# Patient Record
Sex: Female | Born: 1975 | Race: Asian | Hispanic: No | Marital: Single | Smoking: Never smoker
Health system: Southern US, Community
[De-identification: ages and names within clinical notes are randomized; demographics above are authoritative.]

## PROBLEM LIST (undated history)

## (undated) DIAGNOSIS — Z8742 Personal history of other diseases of the female genital tract: Secondary | ICD-10-CM

## (undated) DIAGNOSIS — D649 Anemia, unspecified: Secondary | ICD-10-CM

## (undated) HISTORY — DX: Personal history of other diseases of the female genital tract: Z87.42

## (undated) HISTORY — PX: OTHER SURGICAL HISTORY: SHX169

## (undated) HISTORY — DX: Anemia, unspecified: D64.9

## (undated) HISTORY — PX: NO PAST SURGERIES: SHX2092

---

## 2016-02-12 DIAGNOSIS — Z124 Encounter for screening for malignant neoplasm of cervix: Secondary | ICD-10-CM | POA: Diagnosis not present

## 2016-02-12 DIAGNOSIS — Z01419 Encounter for gynecological examination (general) (routine) without abnormal findings: Secondary | ICD-10-CM | POA: Diagnosis not present

## 2016-02-12 DIAGNOSIS — R87612 Low grade squamous intraepithelial lesion on cytologic smear of cervix (LGSIL): Secondary | ICD-10-CM | POA: Diagnosis not present

## 2016-02-12 LAB — HM PAP SMEAR: HM PAP: POSITIVE

## 2016-05-09 DIAGNOSIS — N870 Mild cervical dysplasia: Secondary | ICD-10-CM | POA: Diagnosis not present

## 2016-05-09 DIAGNOSIS — R87810 Cervical high risk human papillomavirus (HPV) DNA test positive: Secondary | ICD-10-CM | POA: Diagnosis not present

## 2016-05-09 DIAGNOSIS — R87612 Low grade squamous intraepithelial lesion on cytologic smear of cervix (LGSIL): Secondary | ICD-10-CM | POA: Diagnosis not present

## 2016-09-05 ENCOUNTER — Ambulatory Visit: Payer: Self-pay | Admitting: Internal Medicine

## 2016-09-05 ENCOUNTER — Ambulatory Visit (INDEPENDENT_AMBULATORY_CARE_PROVIDER_SITE_OTHER): Payer: 59 | Admitting: Internal Medicine

## 2016-09-05 ENCOUNTER — Encounter: Payer: Self-pay | Admitting: Internal Medicine

## 2016-09-05 DIAGNOSIS — Z8371 Family history of colonic polyps: Secondary | ICD-10-CM | POA: Diagnosis not present

## 2016-09-05 DIAGNOSIS — E559 Vitamin D deficiency, unspecified: Secondary | ICD-10-CM | POA: Diagnosis not present

## 2016-09-05 DIAGNOSIS — D649 Anemia, unspecified: Secondary | ICD-10-CM | POA: Diagnosis not present

## 2016-09-05 DIAGNOSIS — Z87898 Personal history of other specified conditions: Secondary | ICD-10-CM

## 2016-09-05 DIAGNOSIS — Z8742 Personal history of other diseases of the female genital tract: Secondary | ICD-10-CM

## 2016-09-05 NOTE — Progress Notes (Signed)
Patient ID: Alicia Huang, female   DOB: [birth year], 40 y.o.   MRN: [id number]   Subjective:    Patient ID: Alicia Huang, female    DOB: Nov 03, 1976, 40 y.o.   MRN: [id number]  HPI  Patient here for to establish care.  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  She stays active.  No chest pain or tightness.  No sob.  No acid reflux.  No abdominal pain or cramping.  Bowels stable.  Sees gyn.  Had abnormal pap smear.  S/p colposcopy.  States everything checked out fine last check.  Recommended f/u in one year.  Has been told was vitamin D deficient.  Also has had some issues with anemia.  Periods regular.  LMP now.    Past Medical History:  Diagnosis Date  . Anemia   . History of abnormal cervical Pap smear    Past Surgical History:  Procedure Laterality Date  . colpolscopy     Family History  Problem Relation Age of Onset  . Hyperlipidemia Mother   . Hyperlipidemia Father   . Hypertension Father    Social History   Social History  . Marital status: Unknown    Spouse name: N/A  . Number of children: N/A  . Years of education: N/A   Social History Main Topics  . Smoking status: Never Smoker  . Smokeless tobacco: Never Used  . Alcohol use Yes  . Drug use: No  . Sexual activity: Not Asked   Other Topics Concern  . None   Social History Narrative  . None    Review of Systems  Constitutional: Negative for appetite change and unexpected weight change.  HENT: Negative for congestion and sinus pressure.   Respiratory: Negative for cough, chest tightness and shortness of breath.   Cardiovascular: Negative for chest pain, palpitations and leg swelling.  Gastrointestinal: Negative for abdominal pain, diarrhea, nausea and vomiting.  Genitourinary: Negative for difficulty urinating and dysuria.  Musculoskeletal: Negative for back pain and joint swelling.  Skin: Negative for color change and rash.  Neurological: Negative for dizziness, light-headedness and headaches.    Psychiatric/Behavioral: Negative for agitation and dysphoric mood.       Objective:    Physical Exam  Constitutional: She appears well-developed and well-nourished. No distress.  HENT:  Nose: Nose normal.  Mouth/Throat: Oropharynx is clear and moist.  Neck: Neck supple. No thyromegaly present.  Cardiovascular: Normal rate and regular rhythm.   Pulmonary/Chest: Breath sounds normal. No respiratory distress. She has no wheezes.  Abdominal: Soft. Bowel sounds are normal. There is no tenderness.  Musculoskeletal: She exhibits no edema or tenderness.  Lymphadenopathy:    She has no cervical adenopathy.  Skin: No rash noted. No erythema.  Psychiatric: She has a normal mood and affect. Her behavior is normal.    BP 120/72   Pulse 76   Temp 97.8 F (36.6 C) (Oral)   Ht 5\' 1"  (1.549 m)   Wt 115 lb 3.2 oz (52.3 kg)   LMP 09/04/2016 (Exact Date)   SpO2 98%   BMI 21.77 kg/m  Wt Readings from Last 3 Encounters:  09/05/16 115 lb 3.2 oz (52.3 kg)       Assessment & Plan:   Problem List Items Addressed This Visit    Anemia   Relevant Orders   CBC with Differential/Platelet   Comprehensive metabolic panel   TSH   Family history of colonic polyps    States mother and father have history of polyps.  Discussed need for earlier screening colonoscopy.        History of abnormal cervical Pap smear    Seeing gyn.  S/p colposcopy.  Recommended f/u in one year.        Vitamin D deficiency    Found on last lab check.  Discussed supplements.  Follow vitamin D level.        Relevant Orders   VITAMIN D 25 Hydroxy (Vit-D Deficiency, Fractures)    Other Visit Diagnoses   None.      Einar Pheasant, MD

## 2016-09-05 NOTE — Progress Notes (Signed)
Pre visit review using our clinic review tool, if applicable. No additional management support is needed unless otherwise documented below in the visit note. 

## 2016-09-06 ENCOUNTER — Encounter: Payer: Self-pay | Admitting: Internal Medicine

## 2016-09-06 DIAGNOSIS — D649 Anemia, unspecified: Secondary | ICD-10-CM | POA: Insufficient documentation

## 2016-09-06 DIAGNOSIS — Z8371 Family history of colonic polyps: Secondary | ICD-10-CM | POA: Insufficient documentation

## 2016-09-06 DIAGNOSIS — E559 Vitamin D deficiency, unspecified: Secondary | ICD-10-CM | POA: Insufficient documentation

## 2016-09-06 DIAGNOSIS — Z862 Personal history of diseases of the blood and blood-forming organs and certain disorders involving the immune mechanism: Secondary | ICD-10-CM | POA: Insufficient documentation

## 2016-09-06 DIAGNOSIS — Z8742 Personal history of other diseases of the female genital tract: Secondary | ICD-10-CM | POA: Insufficient documentation

## 2016-09-06 NOTE — Assessment & Plan Note (Signed)
States mother and father have history of polyps.  Discussed need for earlier screening colonoscopy.

## 2016-09-06 NOTE — Assessment & Plan Note (Signed)
Found on last lab check.  Discussed supplements.  Follow vitamin D level.

## 2016-09-06 NOTE — Assessment & Plan Note (Signed)
Seeing gyn.  S/p colposcopy.  Recommended f/u in one year.

## 2016-09-11 ENCOUNTER — Other Ambulatory Visit (INDEPENDENT_AMBULATORY_CARE_PROVIDER_SITE_OTHER): Payer: 59

## 2016-09-11 DIAGNOSIS — D649 Anemia, unspecified: Secondary | ICD-10-CM

## 2016-09-11 DIAGNOSIS — E559 Vitamin D deficiency, unspecified: Secondary | ICD-10-CM | POA: Diagnosis not present

## 2016-09-11 LAB — CBC WITH DIFFERENTIAL/PLATELET
BASOS ABS: 0 10*3/uL (ref 0.0–0.1)
Basophils Relative: 0.7 % (ref 0.0–3.0)
EOS ABS: 0.5 10*3/uL (ref 0.0–0.7)
Eosinophils Relative: 11.7 % — ABNORMAL HIGH (ref 0.0–5.0)
HEMATOCRIT: 32 % — AB (ref 36.0–46.0)
HEMOGLOBIN: 10.6 g/dL — AB (ref 12.0–15.0)
LYMPHS PCT: 39.1 % (ref 12.0–46.0)
Lymphs Abs: 1.7 10*3/uL (ref 0.7–4.0)
MCHC: 33.2 g/dL (ref 30.0–36.0)
MCV: 84.5 fl (ref 78.0–100.0)
MONO ABS: 0.4 10*3/uL (ref 0.1–1.0)
Monocytes Relative: 8.4 % (ref 3.0–12.0)
Neutro Abs: 1.7 10*3/uL (ref 1.4–7.7)
Neutrophils Relative %: 40.1 % — ABNORMAL LOW (ref 43.0–77.0)
Platelets: 314 10*3/uL (ref 150.0–400.0)
RBC: 3.79 Mil/uL — AB (ref 3.87–5.11)
RDW: 14.8 % (ref 11.5–15.5)
WBC: 4.2 10*3/uL (ref 4.0–10.5)

## 2016-09-11 LAB — COMPREHENSIVE METABOLIC PANEL
ALBUMIN: 4.1 g/dL (ref 3.5–5.2)
ALT: 8 U/L (ref 0–35)
AST: 13 U/L (ref 0–37)
Alkaline Phosphatase: 48 U/L (ref 39–117)
BILIRUBIN TOTAL: 0.5 mg/dL (ref 0.2–1.2)
BUN: 11 mg/dL (ref 6–23)
CALCIUM: 9.3 mg/dL (ref 8.4–10.5)
CO2: 29 mEq/L (ref 19–32)
CREATININE: 0.61 mg/dL (ref 0.40–1.20)
Chloride: 106 mEq/L (ref 96–112)
GFR: 115.85 mL/min (ref >60.00)
Glucose, Bld: 88 mg/dL (ref 70–99)
Potassium: 4.4 mEq/L (ref 3.5–5.1)
SODIUM: 141 meq/L (ref 135–145)
Total Protein: 7.1 g/dL (ref 6.0–8.3)

## 2016-09-11 LAB — TSH: TSH: 3.18 u[IU]/mL (ref 0.35–4.50)

## 2016-09-11 LAB — VITAMIN D 25 HYDROXY (VIT D DEFICIENCY, FRACTURES): VITD: 23.59 ng/mL — AB (ref 30.00–100.00)

## 2016-09-12 ENCOUNTER — Ambulatory Visit: Payer: Self-pay | Admitting: Internal Medicine

## 2016-09-12 ENCOUNTER — Other Ambulatory Visit: Payer: Self-pay | Admitting: Internal Medicine

## 2016-09-12 DIAGNOSIS — D649 Anemia, unspecified: Secondary | ICD-10-CM

## 2016-09-12 NOTE — Progress Notes (Signed)
Order placed for f/u labs.  

## 2016-10-21 ENCOUNTER — Encounter: Payer: Self-pay | Admitting: Internal Medicine

## 2016-10-27 ENCOUNTER — Encounter: Payer: Self-pay | Admitting: Internal Medicine

## 2016-10-27 DIAGNOSIS — Z0000 Encounter for general adult medical examination without abnormal findings: Secondary | ICD-10-CM | POA: Insufficient documentation

## 2016-11-20 ENCOUNTER — Other Ambulatory Visit (INDEPENDENT_AMBULATORY_CARE_PROVIDER_SITE_OTHER): Payer: 59

## 2016-11-20 ENCOUNTER — Other Ambulatory Visit: Payer: Self-pay | Admitting: Internal Medicine

## 2016-11-20 DIAGNOSIS — D649 Anemia, unspecified: Secondary | ICD-10-CM

## 2016-11-20 DIAGNOSIS — D509 Iron deficiency anemia, unspecified: Secondary | ICD-10-CM

## 2016-11-20 LAB — VITAMIN B12: Vitamin B-12: 453 pg/mL (ref 211–911)

## 2016-11-20 LAB — IBC PANEL
IRON: 29 ug/dL — AB (ref 42–145)
Saturation Ratios: 7 % — ABNORMAL LOW (ref 20.0–50.0)
Transferrin: 294 mg/dL (ref 212.0–360.0)

## 2016-11-20 LAB — CBC WITH DIFFERENTIAL/PLATELET
BASOS ABS: 0 10*3/uL (ref 0.0–0.1)
BASOS PCT: 0.7 % (ref 0.0–3.0)
Eosinophils Absolute: 0.7 10*3/uL (ref 0.0–0.7)
Eosinophils Relative: 12.4 % — ABNORMAL HIGH (ref 0.0–5.0)
HEMATOCRIT: 32 % — AB (ref 36.0–46.0)
Hemoglobin: 10.8 g/dL — ABNORMAL LOW (ref 12.0–15.0)
LYMPHS PCT: 26 % (ref 12.0–46.0)
Lymphs Abs: 1.5 10*3/uL (ref 0.7–4.0)
MCHC: 33.6 g/dL (ref 30.0–36.0)
MCV: 85.2 fl (ref 78.0–100.0)
MONOS PCT: 9.2 % (ref 3.0–12.0)
Monocytes Absolute: 0.5 10*3/uL (ref 0.1–1.0)
NEUTROS ABS: 3 10*3/uL (ref 1.4–7.7)
Neutrophils Relative %: 51.7 % (ref 43.0–77.0)
PLATELETS: 269 10*3/uL (ref 150.0–400.0)
RBC: 3.76 Mil/uL — ABNORMAL LOW (ref 3.87–5.11)
RDW: 16.3 % — ABNORMAL HIGH (ref 11.5–15.5)
WBC: 5.8 10*3/uL (ref 4.0–10.5)

## 2016-11-20 LAB — FERRITIN: Ferritin: 6 ng/mL — ABNORMAL LOW (ref 10.0–291.0)

## 2016-11-20 NOTE — Progress Notes (Signed)
Order placed for f/u cbc and ferritin.   

## 2017-07-31 DIAGNOSIS — N921 Excessive and frequent menstruation with irregular cycle: Secondary | ICD-10-CM | POA: Diagnosis not present

## 2017-07-31 DIAGNOSIS — Z01419 Encounter for gynecological examination (general) (routine) without abnormal findings: Secondary | ICD-10-CM | POA: Diagnosis not present

## 2017-09-07 ENCOUNTER — Encounter: Payer: 59 | Admitting: Internal Medicine

## 2017-09-25 ENCOUNTER — Encounter: Payer: 59 | Admitting: Internal Medicine

## 2017-10-09 ENCOUNTER — Ambulatory Visit (INDEPENDENT_AMBULATORY_CARE_PROVIDER_SITE_OTHER): Payer: 59 | Admitting: Internal Medicine

## 2017-10-09 ENCOUNTER — Encounter: Payer: Self-pay | Admitting: Internal Medicine

## 2017-10-09 ENCOUNTER — Other Ambulatory Visit: Payer: Self-pay

## 2017-10-09 VITALS — BP 110/60 | HR 76 | Temp 98.0°F | Resp 14 | Ht 61.0 in | Wt 116.2 lb

## 2017-10-09 DIAGNOSIS — D649 Anemia, unspecified: Secondary | ICD-10-CM | POA: Diagnosis not present

## 2017-10-09 DIAGNOSIS — E559 Vitamin D deficiency, unspecified: Secondary | ICD-10-CM

## 2017-10-09 DIAGNOSIS — Z0001 Encounter for general adult medical examination with abnormal findings: Secondary | ICD-10-CM | POA: Diagnosis not present

## 2017-10-09 DIAGNOSIS — Z0000 Encounter for general adult medical examination without abnormal findings: Secondary | ICD-10-CM | POA: Diagnosis not present

## 2017-10-09 DIAGNOSIS — N926 Irregular menstruation, unspecified: Secondary | ICD-10-CM | POA: Diagnosis not present

## 2017-10-09 DIAGNOSIS — Z8742 Personal history of other diseases of the female genital tract: Secondary | ICD-10-CM

## 2017-10-09 DIAGNOSIS — Z8371 Family history of colonic polyps: Secondary | ICD-10-CM

## 2017-10-09 DIAGNOSIS — Z87898 Personal history of other specified conditions: Secondary | ICD-10-CM

## 2017-10-09 DIAGNOSIS — Z1239 Encounter for other screening for malignant neoplasm of breast: Secondary | ICD-10-CM

## 2017-10-09 DIAGNOSIS — Z1231 Encounter for screening mammogram for malignant neoplasm of breast: Secondary | ICD-10-CM | POA: Diagnosis not present

## 2017-10-09 LAB — POCT URINE PREGNANCY: Preg Test, Ur: NEGATIVE

## 2017-10-09 NOTE — Progress Notes (Signed)
Patient ID: Alicia Huang, female   DOB: [birth year], 41 y.o.   MRN: [id number]   Subjective:    Patient ID: Alicia Huang, female    DOB: [birth year], 41 y.o.   MRN: [id number]  HPI  Patient with past history of anemia and abnormal pap smear.  Followed by gyn.  Her to follow up on these issues as well as for a complete physical exam.  Just saw Dr Leafy Ro 07/31/17.  Note reviewed.  Had pap.  Reports was having problems with her period.  Heavy period and having two periods per month.  She is taking an herbal medication and acupuncture.  States prior to this month she had two regular periods.  Now with light spotting.  Does not use protection.  Eating and drinking well.  Has been exercising more.  No chest pain.  No sob.  No acid reflux.  No abdominal pain.  Bowels moving.     Past Medical History:  Diagnosis Date  . Anemia   . History of abnormal cervical Pap smear    Past Surgical History:  Procedure Laterality Date  . colpolscopy     Family History  Problem Relation Age of Onset  . Hyperlipidemia Mother   . Hyperlipidemia Father   . Hypertension Father    Social History   Socioeconomic History  . Marital status: Unknown    Spouse name: None  . Number of children: None  . Years of education: None  . Highest education level: None  Social Needs  . Financial resource strain: None  . Food insecurity - worry: None  . Food insecurity - inability: None  . Transportation needs - medical: None  . Transportation needs - non-medical: None  Occupational History  . None  Tobacco Use  . Smoking status: Never Smoker  . Smokeless tobacco: Never Used  Substance and Sexual Activity  . Alcohol use: Yes  . Drug use: No  . Sexual activity: None  Other Topics Concern  . None  Social History Narrative  . None    Outpatient Encounter Medications as of 10/09/2017  Medication Sig  . ferrous sulfate 300 (60 Fe) MG/5ML syrup Take by mouth.  . Omega-3 Fatty Acids (FISH OIL PO) Take by  mouth.   No facility-administered encounter medications on file as of 10/09/2017.     Review of Systems  Constitutional: Negative for appetite change and unexpected weight change.  HENT: Negative for congestion and sinus pressure.   Eyes: Negative for pain and visual disturbance.  Respiratory: Negative for cough, chest tightness and shortness of breath.   Cardiovascular: Negative for chest pain, palpitations and leg swelling.  Gastrointestinal: Negative for abdominal pain, diarrhea, nausea and vomiting.  Genitourinary: Negative for difficulty urinating and dysuria.       Period change as outlined.    Musculoskeletal: Negative for back pain and joint swelling.  Skin: Negative for color change and rash.  Neurological: Negative for dizziness, light-headedness and headaches.  Hematological: Negative for adenopathy. Does not bruise/bleed easily.  Psychiatric/Behavioral: Negative for agitation and dysphoric mood.       Objective:    Physical Exam  Constitutional: She is oriented to person, place, and time. She appears well-developed and well-nourished. No distress.  HENT:  Nose: Nose normal.  Mouth/Throat: Oropharynx is clear and moist.  Eyes: Right eye exhibits no discharge. Left eye exhibits no discharge. No scleral icterus.  Neck: Neck supple. No thyromegaly present.  Cardiovascular: Normal rate and regular rhythm.  Pulmonary/Chest: Breath sounds normal. No  accessory muscle usage. No tachypnea. No respiratory distress. She has no decreased breath sounds. She has no wheezes. She has no rhonchi. Right breast exhibits no inverted nipple, no mass, no nipple discharge and no tenderness (no axillary adenopathy). Left breast exhibits no inverted nipple, no mass, no nipple discharge and no tenderness (no axilarry adenopathy).  Abdominal: Soft. Bowel sounds are normal. There is no tenderness.  Musculoskeletal: She exhibits no edema or tenderness.  Lymphadenopathy:    She has no cervical  adenopathy.  Neurological: She is alert and oriented to person, place, and time.  Skin: Skin is warm. No rash noted. No erythema.  Psychiatric: She has a normal mood and affect. Her behavior is normal.    BP 110/60 (BP Location: Left Arm, Patient Position: Sitting, Cuff Size: Normal)   Pulse 76   Temp 98 F (36.7 C) (Oral)   Resp 14   Ht 5\' 1"  (1.549 m)   Wt 116 lb 3.2 oz (52.7 kg)   LMP 09/28/2017   SpO2 98%   BMI 21.96 kg/m  Wt Readings from Last 3 Encounters:  10/09/17 116 lb 3.2 oz (52.7 kg)  09/05/16 115 lb 3.2 oz (52.3 kg)     Lab Results  Component Value Date   WBC 5.8 11/20/2016   HGB 10.8 (L) 11/20/2016   HCT 32.0 (L) 11/20/2016   PLT 269.0 11/20/2016   GLUCOSE 88 09/11/2016   ALT 8 09/11/2016   AST 13 09/11/2016   NA 141 09/11/2016   K 4.4 09/11/2016   CL 106 09/11/2016   CREATININE 0.61 09/11/2016   BUN 11 09/11/2016   CO2 29 09/11/2016   TSH 3.18 09/11/2016       Assessment & Plan:   Problem List Items Addressed This Visit    Anemia    Follow cbc.       Relevant Medications   ferrous sulfate 300 (60 Fe) MG/5ML syrup   Family history of colonic polyps    States mother and father had polyps.  Have discussed the need for early screening.        Healthcare maintenance    Physical today 10/09/17.  Sees gyn for pelvic and pap smears.  Just recently evaluated.  Schedule mammogram.        History of abnormal cervical Pap smear    Followed by gyn.       Vitamin D deficiency    Follow vitamin D level.         Other Visit Diagnoses    Abnormal menstrual periods    -  Primary   period change as outlined.  discussed with her.  urine pregnancy test negative.  have her keep menstrual diary.  has started exercising more.  follow.    Relevant Orders   POCT urine pregnancy (Completed)   Breast cancer screening       Relevant Orders   MM DIGITAL SCREENING BILATERAL       Einar Pheasant, MD

## 2017-10-11 NOTE — Assessment & Plan Note (Signed)
Follow vitamin D level.  

## 2017-10-11 NOTE — Assessment & Plan Note (Signed)
Follow cbc.  

## 2017-10-11 NOTE — Assessment & Plan Note (Signed)
Followed by gyn

## 2017-10-11 NOTE — Assessment & Plan Note (Signed)
States mother and father had polyps.  Have discussed the need for early screening.

## 2017-10-11 NOTE — Assessment & Plan Note (Signed)
Physical today 10/09/17.  Sees gyn for pelvic and pap smears.  Just recently evaluated.  Schedule mammogram.

## 2017-10-12 ENCOUNTER — Telehealth: Payer: Self-pay | Admitting: *Deleted

## 2017-10-12 NOTE — Telephone Encounter (Signed)
Left message for patient to return cal to office. [facility]EC nurse may give message and document answer .

## 2017-10-12 NOTE — Telephone Encounter (Signed)
-----   Message from Einar Pheasant, MD sent at 10/11/2017  5:16 PM EST ----- Regarding: colon screening Please call pt and notify her that after she left, I was reviewing the chart and that if both of her parents have a history of colon polyps, she needs to be referred for possible colonoscopy.  If agreeable let me know and I will place the order.  Thanks  (let me know either way).    Dr Nicki Reaper

## 2017-10-30 ENCOUNTER — Encounter: Payer: Self-pay | Admitting: *Deleted

## 2017-11-03 ENCOUNTER — Ambulatory Visit: Payer: 59

## 2017-11-11 ENCOUNTER — Telehealth: Payer: Self-pay | Admitting: Internal Medicine

## 2017-11-11 DIAGNOSIS — R0683 Snoring: Secondary | ICD-10-CM

## 2017-11-11 NOTE — Telephone Encounter (Signed)
Please advise 

## 2017-11-11 NOTE — Telephone Encounter (Signed)
Please advise      Copied from [place] [phone]. Topic: Inquiry >> Nov 10, 2017 12:10 PM Aurelio Brash B wrote: Reason for CRM:  Pt is asking if she can put off having a colonoscopy as recommended by Dr Nicki Reaper,  but is asking if she can have a sleep study done first because both her parents have sleep apnea and use c-paps  PT states she is waking herself up snoring.

## 2017-11-12 NOTE — Telephone Encounter (Signed)
I have placed the order for the referral for sleep study.  Someone should be contacting her with an appt date and time.  Also, let her know just to let me know when ready for colonoscopy.

## 2017-11-12 NOTE — Telephone Encounter (Signed)
Mychart message has been sent to [place]inet informing her.

## 2017-11-13 ENCOUNTER — Ambulatory Visit
Admission: RE | Admit: 2017-11-13 | Discharge: 2017-11-13 | Disposition: A | Discharge status: Home / Self Care | Payer: 59 | Source: Ambulatory Visit | Attending: Internal Medicine | Admitting: Internal Medicine

## 2017-11-13 DIAGNOSIS — Z1239 Encounter for other screening for malignant neoplasm of breast: Secondary | ICD-10-CM

## 2017-11-13 DIAGNOSIS — Z1231 Encounter for screening mammogram for malignant neoplasm of breast: Secondary | ICD-10-CM | POA: Insufficient documentation

## 2017-11-16 ENCOUNTER — Other Ambulatory Visit: Payer: Self-pay | Admitting: Internal Medicine

## 2017-11-16 ENCOUNTER — Telehealth: Payer: Self-pay | Admitting: Internal Medicine

## 2017-11-16 DIAGNOSIS — R928 Other abnormal and inconclusive findings on diagnostic imaging of breast: Secondary | ICD-10-CM

## 2017-11-16 NOTE — Progress Notes (Signed)
Order placed for f/u right breast mammogram.  

## 2017-11-16 NOTE — Telephone Encounter (Signed)
Pt given results and recommendations per notes of Dr. Nicki Reaper. Pt verbalized understanding. Pt also wanted to know if f/u xrays of tight breast would be covered under insurance. Unable to document in result note due to result note not being routed to [facility].

## 2017-11-27 ENCOUNTER — Ambulatory Visit
Admission: RE | Admit: 2017-11-27 | Discharge: 2017-11-27 | Disposition: A | Discharge status: Home / Self Care | Payer: 59 | Source: Ambulatory Visit | Attending: Internal Medicine | Admitting: Internal Medicine

## 2017-11-27 ENCOUNTER — Telehealth: Payer: Self-pay

## 2017-11-27 DIAGNOSIS — R921 Mammographic calcification found on diagnostic imaging of breast: Secondary | ICD-10-CM | POA: Insufficient documentation

## 2017-11-27 DIAGNOSIS — R928 Other abnormal and inconclusive findings on diagnostic imaging of breast: Secondary | ICD-10-CM

## 2017-11-27 NOTE — Telephone Encounter (Signed)
Copied from [place]. Topic: Appointment Scheduling - Scheduling Inquiry for Clinic >> Nov 27, 2017  1:36 PM Ether Griffins B wrote: Reason for CRM: pt had a cpe on 10/09/17. She is wanting to know if Dr. Nicki Reaper wants her to do any blood work. Pt was also charged for an office visit when it was suppose to be a physical. Pt has contacted billing to get that corrected. Shes wanting to verify that the appt was indeed a CPE. Pt doesn't want to get charged for the blood work if its not associated with the CPE.

## 2017-11-27 NOTE — Telephone Encounter (Signed)
Please advise 

## 2017-11-27 NOTE — Telephone Encounter (Signed)
Copied from [place] [phone]. Topic: Bill or Statement - Patient/Guarantor Payment >> Nov 27, 2017  1:27 PM Neva Seat wrote: PT has contacted the [place] - [phone] - they told her the bill was being resent to [facility] for the bill to be reprocessed since it was not coded as a physical exam on 20-Oct-2023. Pt is concerened since the 90 day grace period approaching.  She would like a call back to her to let her know of the status of the reprocessed bill.

## 2017-11-27 NOTE — Telephone Encounter (Signed)
Yes it was for a Physical.  It was charted as such.

## 2017-11-30 ENCOUNTER — Other Ambulatory Visit: Payer: Self-pay | Admitting: Internal Medicine

## 2017-11-30 ENCOUNTER — Telehealth: Payer: Self-pay

## 2017-11-30 DIAGNOSIS — R928 Other abnormal and inconclusive findings on diagnostic imaging of breast: Secondary | ICD-10-CM

## 2017-11-30 NOTE — Telephone Encounter (Signed)
Copied from [place]. Topic: General - Other >> Nov 30, 2017 12:00 PM Carolyn Stare wrote:   Pt call to  ask that since it was coded as a physical will the info be submitted back to her insurance company so that they will pay  it was coded as a out patient she said she know she is responsible for the preg test     [phone]

## 2017-11-30 NOTE — Telephone Encounter (Signed)
Hi, Alicia Huang   This is also a service area 100 account. Billing dept contact # [phone].   Thank you,

## 2017-11-30 NOTE — Progress Notes (Signed)
Order placed for f/u right breast mammogram.  Recommended 6 month f/u.  Due 05/2018

## 2017-11-30 NOTE — Telephone Encounter (Signed)
The patient was coded as a [id number], but was seen for a [phone]- physical . Can the visit be re coded and resubmitted to the insurance carrier?

## 2017-12-01 NOTE — Telephone Encounter (Signed)
Lorriane Shire,   I have sent this for correction since it was coded incorrect.    It will take time to process.   Thank you,  Dawn

## 2017-12-02 NOTE — Telephone Encounter (Signed)
Thank you :)

## 2017-12-07 NOTE — Progress Notes (Signed)
The patient was charged for a physical as well as a follow up due to matters that were discussed at the visit . The patient does have a deductible that she has to meet. She may want to call her insurance carrier to see why she was not covered at a 100% for her physical.

## 2018-03-02 ENCOUNTER — Telehealth: Payer: Self-pay | Admitting: Internal Medicine

## 2018-03-02 NOTE — Telephone Encounter (Signed)
Copied from [place] [phone]. Topic: Appointment Scheduling - Scheduling Inquiry for Clinic >> Mar 02, 2018  9:46 AM Conception Chancy, NT wrote: Reason for CRM: patient is calling and states she has brown sun spots on her lip she states she has a appt with dermatologist on 03/05/18 at 8:50. She states she also has a lump under her left breast. Available after 2 on 03/04/18 and available after 11am on 03/05/18. Patient would like to be seen by Dr. Nicki Reaper one of those days for these concerns. Please advise.

## 2018-03-02 NOTE — Telephone Encounter (Signed)
Please advise. Is there any availability on 03-04-18

## 2018-03-02 NOTE — Telephone Encounter (Signed)
Lm for pt to call back and schedule an appt..   that would work best for her.Marland Kitchen

## 2018-03-05 ENCOUNTER — Ambulatory Visit (INDEPENDENT_AMBULATORY_CARE_PROVIDER_SITE_OTHER): Payer: 59 | Admitting: Internal Medicine

## 2018-03-05 ENCOUNTER — Encounter: Payer: Self-pay | Admitting: Internal Medicine

## 2018-03-05 DIAGNOSIS — Z13220 Encounter for screening for lipoid disorders: Secondary | ICD-10-CM

## 2018-03-05 DIAGNOSIS — N649 Disorder of breast, unspecified: Secondary | ICD-10-CM

## 2018-03-05 DIAGNOSIS — D649 Anemia, unspecified: Secondary | ICD-10-CM | POA: Diagnosis not present

## 2018-03-05 DIAGNOSIS — D220 Melanocytic nevi of lip: Secondary | ICD-10-CM | POA: Diagnosis not present

## 2018-03-05 DIAGNOSIS — E559 Vitamin D deficiency, unspecified: Secondary | ICD-10-CM | POA: Diagnosis not present

## 2018-03-05 DIAGNOSIS — R5383 Other fatigue: Secondary | ICD-10-CM | POA: Diagnosis not present

## 2018-03-05 DIAGNOSIS — L578 Other skin changes due to chronic exposure to nonionizing radiation: Secondary | ICD-10-CM | POA: Diagnosis not present

## 2018-03-05 LAB — CBC WITH DIFFERENTIAL/PLATELET
BASOS PCT: 0.7 % (ref 0.0–3.0)
Basophils Absolute: 0 10*3/uL (ref 0.0–0.1)
EOS PCT: 12.7 % — AB (ref 0.0–5.0)
Eosinophils Absolute: 0.6 10*3/uL (ref 0.0–0.7)
HCT: 39.7 % (ref 36.0–46.0)
HEMOGLOBIN: 13.6 g/dL (ref 12.0–15.0)
LYMPHS PCT: 35.3 % (ref 12.0–46.0)
Lymphs Abs: 1.7 10*3/uL (ref 0.7–4.0)
MCHC: 34.2 g/dL (ref 30.0–36.0)
MCV: 93.5 fl (ref 78.0–100.0)
MONO ABS: 0.4 10*3/uL (ref 0.1–1.0)
Monocytes Relative: 7.7 % (ref 3.0–12.0)
Neutro Abs: 2.1 10*3/uL (ref 1.4–7.7)
Neutrophils Relative %: 43.6 % (ref 43.0–77.0)
Platelets: 254 10*3/uL (ref 150.0–400.0)
RBC: 4.24 Mil/uL (ref 3.87–5.11)
RDW: 12.7 % (ref 11.5–15.5)
WBC: 4.9 10*3/uL (ref 4.0–10.5)

## 2018-03-05 LAB — COMPREHENSIVE METABOLIC PANEL
ALT: 9 U/L (ref 0–35)
AST: 11 U/L (ref 0–37)
Albumin: 4.4 g/dL (ref 3.5–5.2)
Alkaline Phosphatase: 41 U/L (ref 39–117)
BUN: 10 mg/dL (ref 6–23)
CALCIUM: 9.5 mg/dL (ref 8.4–10.5)
CO2: 29 meq/L (ref 19–32)
CREATININE: 0.64 mg/dL (ref 0.40–1.20)
Chloride: 102 mEq/L (ref 96–112)
GFR: 108.78 mL/min (ref >60.00)
GLUCOSE: 88 mg/dL (ref 70–99)
Potassium: 4.6 mEq/L (ref 3.5–5.1)
SODIUM: 140 meq/L (ref 135–145)
Total Bilirubin: 1.1 mg/dL (ref 0.2–1.2)
Total Protein: 7.3 g/dL (ref 6.0–8.3)

## 2018-03-05 LAB — LIPID PANEL
CHOL/HDL RATIO: 2
Cholesterol: 228 mg/dL — ABNORMAL HIGH (ref 0–200)
HDL: 91.7 mg/dL (ref >39.00)
LDL Cholesterol: 119 mg/dL — ABNORMAL HIGH (ref 0–99)
NonHDL: 135.91
TRIGLYCERIDES: 83 mg/dL (ref 0.0–149.0)
VLDL: 16.6 mg/dL (ref 0.0–40.0)

## 2018-03-05 LAB — FERRITIN: FERRITIN: 22.9 ng/mL (ref 10.0–291.0)

## 2018-03-05 LAB — VITAMIN B12: Vitamin B-12: 724 pg/mL (ref 211–911)

## 2018-03-05 LAB — VITAMIN D 25 HYDROXY (VIT D DEFICIENCY, FRACTURES): VITD: 26.87 ng/mL — ABNORMAL LOW (ref 30.00–100.00)

## 2018-03-05 NOTE — Progress Notes (Signed)
Patient ID: Alicia Huang, female   DOB: 12/12/1975, 42 y.o.   MRN: [id number]   Subjective:    Patient ID: Alicia Huang, female    DOB: May 04, 1976, 42 y.o.   MRN: [id number]  HPI  Patient here as a work in with concerns regarding palpable breast lump.  Actually on questioning her, she has noticed a superficial lesion on her left breast.  No palpable breast nodule.  No axillary adenopathy.  Recently had mammogram.  Recommended f/u right breast mammogram in June.  No chest pain.  No sob.  Some fatigue.  Would like labs checked.  Previously anemic.  No abdominal pain.  Bowels moving.     Past Medical History:  Diagnosis Date  . Anemia   . History of abnormal cervical Pap smear    Past Surgical History:  Procedure Laterality Date  . colpolscopy     Family History  Problem Relation Age of Onset  . Hyperlipidemia Mother   . Hyperlipidemia Father   . Hypertension Father    Social History   Socioeconomic History  . Marital status: Unknown    Spouse name: Not on file  . Number of children: Not on file  . Years of education: Not on file  . Highest education level: Not on file  Occupational History  . Not on file  Social Needs  . Financial resource strain: Not on file  . Food insecurity:    Worry: Not on file    Inability: Not on file  . Transportation needs:    Medical: Not on file    Non-medical: Not on file  Tobacco Use  . Smoking status: Never Smoker  . Smokeless tobacco: Never Used  Substance and Sexual Activity  . Alcohol use: Yes  . Drug use: No  . Sexual activity: Not on file  Lifestyle  . Physical activity:    Days per week: Not on file    Minutes per session: Not on file  . Stress: Not on file  Relationships  . Social connections:    Talks on phone: Not on file    Gets together: Not on file    Attends religious service: Not on file    Active member of club or organization: Not on file    Attends meetings of clubs or organizations: Not on file   Relationship status: Not on file  Other Topics Concern  . Not on file  Social History Narrative  . Not on file    Outpatient Encounter Medications as of 03/05/2018  Medication Sig  . ferrous sulfate 300 (60 Fe) MG/5ML syrup Take by mouth.  . Omega-3 Fatty Acids (FISH OIL PO) Take by mouth.   No facility-administered encounter medications on file as of 03/05/2018.     Review of Systems  Constitutional: Positive for fatigue. Negative for appetite change and unexpected weight change.  Respiratory: Negative for chest tightness and shortness of breath.   Cardiovascular: Negative for chest pain and leg swelling.  Gastrointestinal: Negative for abdominal pain, diarrhea, nausea and vomiting.  Musculoskeletal: Negative for myalgias.  Skin: Negative for color change and rash.  Neurological: Negative for dizziness, light-headedness and headaches.  Psychiatric/Behavioral: Negative for agitation and dysphoric mood.       Objective:    Physical Exam  Constitutional: She appears well-developed and well-nourished. No distress.  Neck: Neck supple.  Cardiovascular: Normal rate and regular rhythm.  Breasts:  No nipple discharge or nipple retraction present.  Could not appreciate any nodules or axillary adenopathy.  Superficial lesion - left breast.  No palpable nodule.    Pulmonary/Chest: Breath sounds normal. No respiratory distress. She has no wheezes.  Abdominal: Soft. Bowel sounds are normal. There is no tenderness.  Musculoskeletal: She exhibits no edema or tenderness.  Lymphadenopathy:    She has no cervical adenopathy.  Skin: No rash noted. No erythema.  Psychiatric: She has a normal mood and affect. Her behavior is normal.    BP 118/68 (BP Location: Left Arm, Patient Position: Sitting, Cuff Size: Normal)   Pulse 62   Temp 98.3 F (36.8 C) (Oral)   Resp 18   Wt 115 lb 6.4 oz (52.3 kg)   SpO2 97%   BMI 21.80 kg/m  Wt Readings from Last 3 Encounters:  03/05/18 115 lb 6.4 oz (52.3  kg)  10/09/17 116 lb 3.2 oz (52.7 kg)  09/05/16 115 lb 3.2 oz (52.3 kg)     Lab Results  Component Value Date   WBC 4.9 03/05/2018   HGB 13.6 03/05/2018   HCT 39.7 03/05/2018   PLT 254.0 03/05/2018   GLUCOSE 88 03/05/2018   CHOL 228 (H) 03/05/2018   TRIG 83.0 03/05/2018   HDL 91.70 03/05/2018   LDLCALC 119 (H) 03/05/2018   ALT 9 03/05/2018   AST 11 03/05/2018   NA 140 03/05/2018   K 4.6 03/05/2018   CL 102 03/05/2018   CREATININE 0.64 03/05/2018   BUN 10 03/05/2018   CO2 29 03/05/2018   TSH 3.18 09/11/2016    Mm Digital Diagnostic Unilat R  Result Date: 11/27/2017 CLINICAL DATA:  Screening recall for right breast calcifications. EXAM: DIGITAL DIAGNOSTIC RIGHT MAMMOGRAM COMPARISON:  Baseline screening mammogram dated 11/13/2017 ACR Breast Density Category d: The breast tissue is extremely dense, which lowers the sensitivity of mammography. FINDINGS: In the upper-outer quadrant of the right breast, there is a 3 mm group of punctate round calcifications. IMPRESSION: The calcifications in the upper-outer quadrant of the right breast are probably benign. RECOMMENDATION: Six-month follow-up diagnostic right breast mammogram is recommended. I have discussed the findings and recommendations with the patient. Results were also provided in writing at the conclusion of the visit. If applicable, a reminder letter will be sent to the patient regarding the next appointment. BI-RADS CATEGORY  3: Probably benign. Electronically Signed   By: Ammie Ferrier M.D.   On: 11/27/2017 15:14       Assessment & Plan:   Problem List Items Addressed This Visit    Anemia    Recheck cbc, iron stores and B12.        Relevant Orders   CBC with Differential/Platelet (Completed)   Ferritin (Completed)   Vitamin B12 (Completed)   Breast lesion    She is already scheduled for f/u right breast mammogram - to f/u abnormal mammogram.  Given above, will obtain left breast mammogram as well.  Pt prefers to  wait until June.        Relevant Orders   MM Digital Diagnostic Bilat   Vitamin D deficiency    Check vitamin D level.        Relevant Orders   VITAMIN D 25 Hydroxy (Vit-D Deficiency, Fractures) (Completed)    Other Visit Diagnoses    Other fatigue       check cbc, ferritin and B12.  will also check vitamin D level.  is some better with increased sleep.  follow.     Relevant Orders   Comprehensive metabolic panel (Completed)   Screening cholesterol level  Relevant Orders   Lipid panel (Completed)       Einar Pheasant, MD

## 2018-03-07 ENCOUNTER — Encounter: Payer: Self-pay | Admitting: Internal Medicine

## 2018-03-07 DIAGNOSIS — N649 Disorder of breast, unspecified: Secondary | ICD-10-CM | POA: Insufficient documentation

## 2018-03-07 NOTE — Assessment & Plan Note (Signed)
She is already scheduled for f/u right breast mammogram - to f/u abnormal mammogram.  Given above, will obtain left breast mammogram as well.  Pt prefers to wait until June.

## 2018-03-07 NOTE — Assessment & Plan Note (Signed)
Recheck cbc, iron stores and B12.

## 2018-03-07 NOTE — Assessment & Plan Note (Signed)
Check vitamin D level 

## 2018-03-08 ENCOUNTER — Encounter: Payer: Self-pay | Admitting: Internal Medicine

## 2018-03-08 ENCOUNTER — Other Ambulatory Visit: Payer: Self-pay | Admitting: Internal Medicine

## 2018-03-08 DIAGNOSIS — N649 Disorder of breast, unspecified: Secondary | ICD-10-CM

## 2018-03-08 NOTE — Progress Notes (Signed)
Order placed for breast ultrasound

## 2018-03-09 ENCOUNTER — Other Ambulatory Visit: Payer: Self-pay | Admitting: Internal Medicine

## 2018-03-09 DIAGNOSIS — R928 Other abnormal and inconclusive findings on diagnostic imaging of breast: Secondary | ICD-10-CM

## 2018-03-09 DIAGNOSIS — N649 Disorder of breast, unspecified: Secondary | ICD-10-CM

## 2018-03-09 NOTE — Progress Notes (Signed)
Order placed for mammogram.

## 2018-03-16 NOTE — Telephone Encounter (Signed)
Unread mychart message mailed to patient 

## 2018-05-13 ENCOUNTER — Other Ambulatory Visit: Payer: 59

## 2018-05-31 ENCOUNTER — Other Ambulatory Visit: Payer: 59

## 2018-08-12 DIAGNOSIS — Z01419 Encounter for gynecological examination (general) (routine) without abnormal findings: Secondary | ICD-10-CM | POA: Diagnosis not present

## 2018-10-15 ENCOUNTER — Ambulatory Visit (INDEPENDENT_AMBULATORY_CARE_PROVIDER_SITE_OTHER): Payer: 59 | Admitting: Internal Medicine

## 2018-10-15 ENCOUNTER — Encounter: Payer: Self-pay | Admitting: Internal Medicine

## 2018-10-15 VITALS — BP 114/76 | HR 69 | Temp 97.5°F | Resp 18 | Ht 61.0 in | Wt 110.6 lb

## 2018-10-15 DIAGNOSIS — Z13220 Encounter for screening for lipoid disorders: Secondary | ICD-10-CM

## 2018-10-15 DIAGNOSIS — Z8742 Personal history of other diseases of the female genital tract: Secondary | ICD-10-CM | POA: Diagnosis not present

## 2018-10-15 DIAGNOSIS — D649 Anemia, unspecified: Secondary | ICD-10-CM

## 2018-10-15 DIAGNOSIS — E559 Vitamin D deficiency, unspecified: Secondary | ICD-10-CM | POA: Diagnosis not present

## 2018-10-15 LAB — CBC WITH DIFFERENTIAL/PLATELET
Basophils Absolute: 0 10*3/uL (ref 0.0–0.1)
Basophils Relative: 0.7 % (ref 0.0–3.0)
EOS ABS: 0.3 10*3/uL (ref 0.0–0.7)
EOS PCT: 5.8 % — AB (ref 0.0–5.0)
HCT: 38.7 % (ref 36.0–46.0)
Hemoglobin: 13.1 g/dL (ref 12.0–15.0)
Lymphocytes Relative: 40.9 % (ref 12.0–46.0)
Lymphs Abs: 2.2 10*3/uL (ref 0.7–4.0)
MCHC: 34 g/dL (ref 30.0–36.0)
MCV: 93.5 fl (ref 78.0–100.0)
MONO ABS: 0.3 10*3/uL (ref 0.1–1.0)
Monocytes Relative: 6.3 % (ref 3.0–12.0)
NEUTROS ABS: 2.5 10*3/uL (ref 1.4–7.7)
Neutrophils Relative %: 46.3 % (ref 43.0–77.0)
PLATELETS: 260 10*3/uL (ref 150.0–400.0)
RBC: 4.14 Mil/uL (ref 3.87–5.11)
RDW: 11.9 % (ref 11.5–15.5)
WBC: 5.5 10*3/uL (ref 4.0–10.5)

## 2018-10-15 LAB — LIPID PANEL
CHOL/HDL RATIO: 2
CHOLESTEROL: 180 mg/dL (ref 0–200)
HDL: 81.1 mg/dL (ref >39.00)
LDL Cholesterol: 89 mg/dL (ref 0–99)
NonHDL: 98.44
TRIGLYCERIDES: 45 mg/dL (ref 0.0–149.0)
VLDL: 9 mg/dL (ref 0.0–40.0)

## 2018-10-15 LAB — COMPREHENSIVE METABOLIC PANEL
ALT: 10 U/L (ref 0–35)
AST: 14 U/L (ref 0–37)
Albumin: 4.2 g/dL (ref 3.5–5.2)
Alkaline Phosphatase: 52 U/L (ref 39–117)
BUN: 11 mg/dL (ref 6–23)
CALCIUM: 9.4 mg/dL (ref 8.4–10.5)
CHLORIDE: 104 meq/L (ref 96–112)
CO2: 29 meq/L (ref 19–32)
CREATININE: 0.58 mg/dL (ref 0.40–1.20)
GFR: 121.5 mL/min (ref >60.00)
Glucose, Bld: 85 mg/dL (ref 70–99)
POTASSIUM: 4 meq/L (ref 3.5–5.1)
SODIUM: 140 meq/L (ref 135–145)
Total Bilirubin: 0.6 mg/dL (ref 0.2–1.2)
Total Protein: 7.2 g/dL (ref 6.0–8.3)

## 2018-10-15 LAB — TSH: TSH: 1.1 u[IU]/mL (ref 0.35–4.50)

## 2018-10-15 LAB — FERRITIN: Ferritin: 55.4 ng/mL (ref 10.0–291.0)

## 2018-10-15 LAB — VITAMIN D 25 HYDROXY (VIT D DEFICIENCY, FRACTURES): VITD: 37.97 ng/mL (ref 30.00–100.00)

## 2018-10-15 NOTE — Progress Notes (Addendum)
Patient ID: Alicia Huang, female   DOB: [birth year], 42 y.o.   MRN: [id number]   Subjective:    Patient ID: Alicia Huang, female    DOB: 03/14/1976, 42 y.o.   MRN: [id number]  HPI  Patient here for her physical exam.  Was scheduled for physical.  Had breast exam and pelvic exam at gyn.  Overall she feels she is doing well.  Staying active.  No chest pain.  No sob. No acid reflux.  No abdominal pain.  Bowels moving.  Scheduled for f/u mammogram 11/18/18.     Past Medical History:  Diagnosis Date  . Anemia   . History of abnormal cervical Pap smear    Past Surgical History:  Procedure Laterality Date  . colpolscopy     Family History  Problem Relation Age of Onset  . Hyperlipidemia Mother   . Hyperlipidemia Father   . Hypertension Father    Social History   Socioeconomic History  . Marital status: Unknown    Spouse name: Not on file  . Number of children: Not on file  . Years of education: Not on file  . Highest education level: Not on file  Occupational History  . Not on file  Social Needs  . Financial resource strain: Not on file  . Food insecurity:    Worry: Not on file    Inability: Not on file  . Transportation needs:    Medical: Not on file    Non-medical: Not on file  Tobacco Use  . Smoking status: Never Smoker  . Smokeless tobacco: Never Used  Substance and Sexual Activity  . Alcohol use: Yes  . Drug use: No  . Sexual activity: Not on file  Lifestyle  . Physical activity:    Days per week: Not on file    Minutes per session: Not on file  . Stress: Not on file  Relationships  . Social connections:    Talks on phone: Not on file    Gets together: Not on file    Attends religious service: Not on file    Active member of club or organization: Not on file    Attends meetings of clubs or organizations: Not on file    Relationship status: Not on file  Other Topics Concern  . Not on file  Social History Narrative  . Not on file    Outpatient  Encounter Medications as of 10/15/2018  Medication Sig  . cholecalciferol (VITAMIN D3) 25 MCG (1000 UT) tablet Take 1,000 Units by mouth daily.  . ferrous sulfate 300 (60 Fe) MG/5ML syrup Take by mouth.  . Omega-3 Fatty Acids (FISH OIL PO) Take by mouth.   No facility-administered encounter medications on file as of 10/15/2018.     Review of Systems  Constitutional: Negative for appetite change and unexpected weight change.  HENT: Negative for congestion and sinus pressure.   Eyes: Negative for pain and visual disturbance.  Respiratory: Negative for cough, chest tightness and shortness of breath.   Cardiovascular: Negative for chest pain, palpitations and leg swelling.  Gastrointestinal: Negative for abdominal pain, diarrhea, nausea and vomiting.  Genitourinary: Negative for difficulty urinating and dysuria.  Musculoskeletal: Negative for joint swelling and myalgias.  Skin: Negative for color change and rash.  Neurological: Negative for dizziness, light-headedness and headaches.  Hematological: Negative for adenopathy. Does not bruise/bleed easily.  Psychiatric/Behavioral: Negative for agitation and dysphoric mood.       Objective:    Physical Exam  Constitutional: She appears well-developed  and well-nourished. No distress.  HENT:  Nose: Nose normal.  Mouth/Throat: Oropharynx is clear and moist.  Neck: Neck supple. No thyromegaly present.  Cardiovascular: Normal rate and regular rhythm.  Pulmonary/Chest: Breath sounds normal. No respiratory distress. She has no wheezes.  Abdominal: Soft. Bowel sounds are normal. There is no tenderness.  Genitourinary:  Genitourinary Comments: Performed by gyn.   Musculoskeletal: She exhibits no edema or tenderness.  Lymphadenopathy:    She has no cervical adenopathy.  Skin: No rash noted. No erythema.  Psychiatric: She has a normal mood and affect. Her behavior is normal.    BP 114/76 (BP Location: Left Arm, Patient Position: Sitting,  Cuff Size: Normal)   Pulse 69   Temp (!) 97.5 F (36.4 C) (Oral)   Resp 18   Ht 5\' 1"  (1.549 m)   Wt 110 lb 9.6 oz (50.2 kg)   SpO2 99%   BMI 20.90 kg/m  Wt Readings from Last 3 Encounters:  10/15/18 110 lb 9.6 oz (50.2 kg)  03/05/18 115 lb 6.4 oz (52.3 kg)  10/09/17 116 lb 3.2 oz (52.7 kg)     Lab Results  Component Value Date   WBC 5.5 10/15/2018   HGB 13.1 10/15/2018   HCT 38.7 10/15/2018   PLT 260.0 10/15/2018   GLUCOSE 85 10/15/2018   CHOL 180 10/15/2018   TRIG 45.0 10/15/2018   HDL 81.10 10/15/2018   LDLCALC 89 10/15/2018   ALT 10 10/15/2018   AST 14 10/15/2018   NA 140 10/15/2018   K 4.0 10/15/2018   CL 104 10/15/2018   CREATININE 0.58 10/15/2018   BUN 11 10/15/2018   CO2 29 10/15/2018   TSH 1.10 10/15/2018    Mm Digital Diagnostic Unilat R  Result Date: 11/27/2017 CLINICAL DATA:  Screening recall for right breast calcifications. EXAM: DIGITAL DIAGNOSTIC RIGHT MAMMOGRAM COMPARISON:  Baseline screening mammogram dated 11/13/2017 ACR Breast Density Category d: The breast tissue is extremely dense, which lowers the sensitivity of mammography. FINDINGS: In the upper-outer quadrant of the right breast, there is a 3 mm group of punctate round calcifications. IMPRESSION: The calcifications in the upper-outer quadrant of the right breast are probably benign. RECOMMENDATION: Six-month follow-up diagnostic right breast mammogram is recommended. I have discussed the findings and recommendations with the patient. Results were also provided in writing at the conclusion of the visit. If applicable, a reminder letter will be sent to the patient regarding the next appointment. BI-RADS CATEGORY  3: Probably benign. Electronically Signed   By: Ammie Ferrier M.D.   On: 11/27/2017 15:14       Assessment & Plan:   Problem List Items Addressed This Visit    Anemia - Primary    Check cbc and ferritin.        Relevant Orders   CBC with Differential/Platelet (Completed)    Comprehensive metabolic panel (Completed)   TSH (Completed)   Ferritin (Completed)   History of abnormal cervical Pap smear    Followed by gyn.  Just evaluated.  PAP 07/2018.  Negative with negative HPV.       Vitamin D deficiency    Check vitamin D level.        Relevant Orders   VITAMIN D 25 Hydroxy (Vit-D Deficiency, Fractures) (Completed)    Other Visit Diagnoses    Screening cholesterol level       Check cholesterol level.     Relevant Orders   Lipid panel (Completed)       Einar Pheasant, MD

## 2018-10-16 ENCOUNTER — Encounter: Payer: Self-pay | Admitting: Internal Medicine

## 2018-10-24 NOTE — Assessment & Plan Note (Signed)
Check cbc and ferritin 

## 2018-10-24 NOTE — Assessment & Plan Note (Signed)
Check vitamin D level 

## 2018-10-24 NOTE — Assessment & Plan Note (Signed)
Followed by gyn.  Just evaluated.  PAP 07/2018.  Negative with negative HPV.

## 2018-10-28 DIAGNOSIS — Z3009 Encounter for other general counseling and advice on contraception: Secondary | ICD-10-CM | POA: Diagnosis not present

## 2018-10-28 DIAGNOSIS — N926 Irregular menstruation, unspecified: Secondary | ICD-10-CM | POA: Diagnosis not present

## 2018-11-11 DIAGNOSIS — Z30430 Encounter for insertion of intrauterine contraceptive device: Secondary | ICD-10-CM | POA: Diagnosis not present

## 2018-11-11 DIAGNOSIS — Z3200 Encounter for pregnancy test, result unknown: Secondary | ICD-10-CM | POA: Diagnosis not present

## 2018-11-18 ENCOUNTER — Ambulatory Visit
Admission: RE | Admit: 2018-11-18 | Discharge: 2018-11-18 | Disposition: A | Discharge status: Home / Self Care | Payer: 59 | Source: Ambulatory Visit | Attending: Internal Medicine | Admitting: Internal Medicine

## 2018-11-18 DIAGNOSIS — N649 Disorder of breast, unspecified: Secondary | ICD-10-CM

## 2018-11-18 DIAGNOSIS — R928 Other abnormal and inconclusive findings on diagnostic imaging of breast: Secondary | ICD-10-CM | POA: Diagnosis not present

## 2018-11-18 DIAGNOSIS — R921 Mammographic calcification found on diagnostic imaging of breast: Secondary | ICD-10-CM | POA: Diagnosis not present

## 2018-11-19 ENCOUNTER — Other Ambulatory Visit: Payer: Self-pay | Admitting: Internal Medicine

## 2018-11-19 DIAGNOSIS — R921 Mammographic calcification found on diagnostic imaging of breast: Secondary | ICD-10-CM

## 2018-11-19 NOTE — Progress Notes (Signed)
Order placed for f/u bilateral diagnostic mammogram with ultrasounds ordered.

## 2018-12-22 DIAGNOSIS — Z30431 Encounter for routine checking of intrauterine contraceptive device: Secondary | ICD-10-CM | POA: Diagnosis not present

## 2019-02-10 ENCOUNTER — Ambulatory Visit: Payer: Self-pay

## 2019-02-10 NOTE — Telephone Encounter (Signed)
Patient called and asked if we are screening health care workers for COVID-19. 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 I advised there is no screening for patients unless they have the symptoms, traveled to the high risk areas or in contact with a positive COVID-19 person, advised her to call back once she finds out if the patient's are positive or if she starts having symptoms. Patient verbalized understanding.  Reason for Disposition . Health Information question, no triage required and triager able to answer question  Protocols used: INFORMATION ONLY CALL-A-AH

## 2019-03-23 ENCOUNTER — Encounter: Payer: 59 | Admitting: Obstetrics & Gynecology

## 2019-05-11 ENCOUNTER — Encounter: Payer: 59 | Admitting: Obstetrics & Gynecology

## 2019-06-15 DIAGNOSIS — L578 Other skin changes due to chronic exposure to nonionizing radiation: Secondary | ICD-10-CM | POA: Diagnosis not present

## 2019-08-02 ENCOUNTER — Encounter: Payer: Self-pay | Admitting: Internal Medicine

## 2019-08-19 DIAGNOSIS — T8332XD Displacement of intrauterine contraceptive device, subsequent encounter: Secondary | ICD-10-CM | POA: Diagnosis not present

## 2019-08-19 DIAGNOSIS — Z01419 Encounter for gynecological examination (general) (routine) without abnormal findings: Secondary | ICD-10-CM | POA: Diagnosis not present

## 2019-10-11 ENCOUNTER — Other Ambulatory Visit: Admission: RE | Admit: 2019-10-11 | Payer: 59 | Source: Ambulatory Visit

## 2019-10-12 ENCOUNTER — Encounter
Admission: RE | Admit: 2019-10-12 | Discharge: 2019-10-12 | Disposition: A | Discharge status: Home / Self Care | Payer: 59 | Source: Ambulatory Visit | Attending: Obstetrics and Gynecology | Admitting: Obstetrics and Gynecology

## 2019-10-12 DIAGNOSIS — Z01812 Encounter for preprocedural laboratory examination: Secondary | ICD-10-CM | POA: Insufficient documentation

## 2019-10-12 NOTE — H&P (Signed)
Patient ID: Candy Lohn is a 43 y.o. female   HPI: Incidental finding of 4cm dermoid on right ovary.  Workup:  Pap: up to date  TVUS:  4 cm probable dermoid noted in right ovary. Multiple small ovaries  Fibroid ut seen Ut= 7.01 x 4.36 x 5.72 cm  1 rt ant=27 mm 2 mid ant=27 mm 3 lt ant= 25 mm bil ovs wnl  Endometrium=6.37 mm iud seen in fundus   Complex cystic mass seen with hyperechoic area=1.41 x 1.13 x 1.33 cm sup to ut fundus=4.28 x 4.08 x 3.59 cm   Probable Dermoid cyst   Past Medical History:  has no past medical history on file.  Past Surgical History:  has no past surgical history on file. Family History: family history includes High blood pressure (Hypertension) in her father; Hyperlipidemia (Elevated cholesterol) in her father and mother; Osteoarthritis in her mother; Osteoporosis (Thinning of bones) in her mother. Social History:  reports that she has never smoked. She has never used smokeless tobacco. She reports current alcohol use. She reports that she does not use drugs. OB/GYN History:  OB History    Gravida  0   Para  0   Term  0   Preterm  0   AB  0   Living  0     SAB  0   TAB  0   Ectopic  0   Molar      Multiple  0   Live Births             Allergies: has No Known Allergies. Medications:  Current Outpatient Medications:  .  ferrous sulfate 300 mg (60 mg iron)/5 mL oral solution, Take 300 mg by mouth once a week. Reported on 02/12/2016 , Disp: , Rfl:  .  levonorgestrel (MIRENA 52 MG) 20 mcg/24 hr (5 years) IUD, Insert 1 each into the uterus once Follow package directions., Disp: , Rfl:  .  omega-3 fatty acids/fish oil 340-1,000 mg capsule, Take 1 capsule by mouth monthly. Reported on 02/12/2016 , Disp: , Rfl:    Review of Systems: No SOB, no palpitations or chest pain, no new lower extremity edema, no nausea or vomiting or bowel or bladder complaints. See HPI for gyn specific ROS.   Exam:     General:  Patient is well-groomed, well-nourished, appears stated age in no acute distress  HEENT: head is atraumatic and normocephalic, trachea is midline, neck is supple with no palpable nodules  CV: Regular rhythm and normal heart rate, no murmur  Pulm: Clear to auscultation throughout lung fields with no wheezing, crackles, or rhonchi. 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    Plan:    Patient returns for a preoperative discussion regarding her plans to proceed with surgical treatment of her dermoid cyst, 4cm in right ovary.  Plan for lap cystectomy. The patient and I discussed the technical aspects of the procedure including the potential for risks and complications.  These include but are not limited to the risk of infection requiring post-operative antibiotics or further procedures.  We talked about the risk of injury to adjacent organs including bladder, bowel, ureter, blood vessels or nerves.  We talked about the need to convert to an open incision.  We talked about the possible need for blood transfusion.  We talked about postop complications  such as thromboembolic or cardiopulmonary complications.  All of her questions were answered.  Her preoperative exam was completed and the appropriate consents were signed. She is scheduled to undergo this procedure in the near future.  Return in about 2 weeks (around 10/26/2019) for Postop check.  Sherrie George, MD

## 2019-10-12 NOTE — Patient Instructions (Addendum)
Your procedure is scheduled on: 10-17-19 MONDAY Report to Same Day Surgery 2nd floor medical mall [facility] Entrance-take elevator on left to 2nd floor.  Check in with surgery information desk.) To find out your arrival time please call [phone] between 1PM - 3PM on 10-14-19 FRIDAY  Remember: Instructions that are not followed completely may result in serious medical risk, up to and including death, or upon the discretion of your surgeon and anesthesiologist your surgery may need to be rescheduled.    _x___ 1. Do not eat food after midnight the night before your procedure. NO GUM OR CANDY AFTER MIDNIGHT. You may drink clear liquids up to 2 hours before you are scheduled to arrive at the hospital for your procedure.  Do not drink clear liquids within 2 hours of your scheduled arrival to the hospital.  Clear liquids include  --Water or Apple juice without pulp  --Gatorade  --Black Coffee or Clear Tea (No milk, no creamers, do not add anything to the coffee or Tea   ____Ensure clear carbohydrate drink on the way to the hospital for bariatric patients  _X___Ensure clear carbohydrate drink 3 hours before surgery.    __x__ 2. No Alcohol for 24 hours before or after surgery.   __x__3. No Smoking or e-cigarettes for 24 prior to surgery.  Do not use any chewable tobacco products for at least 6 hour prior to surgery   ____  4. Bring all medications with you on the day of surgery if instructed.    __x__ 5. Notify your doctor if there is any change in your medical condition     (cold, fever, infections).    x___6. On the morning of surgery brush your teeth with toothpaste and water.  You may rinse your mouth with mouth wash if you wish.  Do not swallow any toothpaste or mouthwash.   Do not wear jewelry, make-up, hairpins, clips or nail polish.  Do not wear lotions, powders, or perfumes.  Do not shave 48 hours prior to surgery. Men may shave face and neck.  Do not bring valuables to  the hospital.    [facility] is not responsible for any belongings or valuables.               Contacts, dentures or bridgework may not be worn into surgery.  Leave your suitcase in the car. After surgery it may be brought to your room.  For patients admitted to the hospital, discharge time is determined by your treatment team.  _  Patients discharged the day of surgery will not be allowed to drive home.  You will need someone to drive you home and stay with you the night of your procedure.    Please read over the following fact sheets that you were given:   [facility] Preparing for Surgery and or MRSA Information   ____ Take anti-hypertensive listed below, cardiac, seizure, asthma, anti-reflux and psychiatric medicines. These include:  1. NONE  2.  3.  4.  5.  6.  ____Fleets enema or Magnesium Citrate as directed.   _x___ Use CHG Soap or sage wipes as directed on instruction sheet   ____ Use inhalers on the day of surgery and bring to hospital day of surgery  ____ Stop Metformin and Janumet 2 days prior to surgery.    ____ Take 1/2 of usual insulin dose the night before surgery and none on the morning     surgery.   ____ Follow recommendations from Cardiologist, Pulmonologist  or PCP regarding stopping Aspirin, Coumadin, Plavix ,Eliquis, Effient, or Pradaxa, and Pletal.  X____Stop Anti-inflammatories such as Advil, Aleve, Ibuprofen, Motrin, Naproxen, Naprosyn, Goodies powders or aspirin products NOW-OK to take Tylenol    _x___ Stop supplements until after surgery-STOP ELDERBERRY, IMMUNE SUPPORT AND INOSITOL NOW-MAY RESUME AFTER SURGERY   ____ Bring C-Pap to the hospital.

## 2019-10-13 ENCOUNTER — Other Ambulatory Visit
Admission: RE | Admit: 2019-10-13 | Discharge: 2019-10-13 | Disposition: A | Discharge status: Home / Self Care | Payer: 59 | Source: Ambulatory Visit | Attending: Obstetrics and Gynecology | Admitting: Obstetrics and Gynecology

## 2019-10-13 DIAGNOSIS — Z01812 Encounter for preprocedural laboratory examination: Secondary | ICD-10-CM | POA: Diagnosis not present

## 2019-10-13 DIAGNOSIS — Z20828 Contact with and (suspected) exposure to other viral communicable diseases: Secondary | ICD-10-CM | POA: Diagnosis not present

## 2019-10-13 LAB — BASIC METABOLIC PANEL
Anion gap: 8 (ref 5–15)
BUN: 14 mg/dL (ref 6–20)
CO2: 28 mmol/L (ref 22–32)
Calcium: 9 mg/dL (ref 8.9–10.3)
Chloride: 104 mmol/L (ref 98–111)
Creatinine, Ser: 0.5 mg/dL (ref 0.44–1.00)
GFR calc Af Amer: >60 mL/min (ref >60)
GFR calc non Af Amer: >60 mL/min (ref >60)
Glucose, Bld: 86 mg/dL (ref 70–99)
Potassium: 3.6 mmol/L (ref 3.5–5.1)
Sodium: 140 mmol/L (ref 135–145)

## 2019-10-13 LAB — CBC
HCT: 37.1 % (ref 36.0–46.0)
Hemoglobin: 12.3 g/dL (ref 12.0–15.0)
MCH: 30.8 pg (ref 26.0–34.0)
MCHC: 33.2 g/dL (ref 30.0–36.0)
MCV: 93 fL (ref 80.0–100.0)
Platelets: 229 10*3/uL (ref 150–400)
RBC: 3.99 MIL/uL (ref 3.87–5.11)
RDW: 11.6 % (ref 11.5–15.5)
WBC: 3.6 10*3/uL — ABNORMAL LOW (ref 4.0–10.5)
nRBC: 0 % (ref 0.0–0.2)

## 2019-10-13 LAB — TYPE AND SCREEN
ABO/RH(D): O POS
Antibody Screen: NEGATIVE

## 2019-10-13 LAB — SARS CORONAVIRUS 2 (TAT 6-24 HRS): SARS Coronavirus 2: NEGATIVE

## 2019-10-16 ENCOUNTER — Encounter: Payer: Self-pay | Admitting: Anesthesiology

## 2019-10-17 ENCOUNTER — Other Ambulatory Visit: Payer: Self-pay

## 2019-10-17 ENCOUNTER — Ambulatory Visit: Payer: 59 | Admitting: Anesthesiology

## 2019-10-17 ENCOUNTER — Encounter
Admission: RE | Disposition: A | Discharge status: Home / Self Care | Payer: Self-pay | Source: Home / Self Care | Attending: Obstetrics and Gynecology

## 2019-10-17 ENCOUNTER — Encounter: Payer: Self-pay | Admitting: *Deleted

## 2019-10-17 ENCOUNTER — Ambulatory Visit
Admission: RE | Admit: 2019-10-17 | Discharge: 2019-10-17 | Disposition: A | Discharge status: Home / Self Care | Payer: 59 | Attending: Obstetrics and Gynecology | Admitting: Obstetrics and Gynecology

## 2019-10-17 DIAGNOSIS — Z793 Long term (current) use of hormonal contraceptives: Secondary | ICD-10-CM | POA: Diagnosis not present

## 2019-10-17 DIAGNOSIS — N83201 Unspecified ovarian cyst, right side: Secondary | ICD-10-CM | POA: Diagnosis present

## 2019-10-17 DIAGNOSIS — Z79899 Other long term (current) drug therapy: Secondary | ICD-10-CM | POA: Insufficient documentation

## 2019-10-17 DIAGNOSIS — D270 Benign neoplasm of right ovary: Secondary | ICD-10-CM | POA: Insufficient documentation

## 2019-10-17 DIAGNOSIS — R1909 Other intra-abdominal and pelvic swelling, mass and lump: Secondary | ICD-10-CM | POA: Diagnosis not present

## 2019-10-17 HISTORY — PX: LAPAROSCOPIC OVARIAN CYSTECTOMY: SHX6248

## 2019-10-17 HISTORY — PX: LAPAROSCOPY: SHX197

## 2019-10-17 LAB — POCT PREGNANCY, URINE: Preg Test, Ur: NEGATIVE

## 2019-10-17 LAB — ABO/RH: ABO/RH(D): O POS

## 2019-10-17 SURGERY — LAPAROSCOPY OPERATIVE
Anesthesia: General | Laterality: Right

## 2019-10-17 MED ORDER — BUPIVACAINE HCL (PF) 0.5 % IJ SOLN
INTRAMUSCULAR | Status: AC
  Filled 2019-10-17: qty 30

## 2019-10-17 MED ORDER — FENTANYL CITRATE (PF) 100 MCG/2ML IJ SOLN
INTRAMUSCULAR | Status: AC
  Administered 2019-10-17: 25 ug via INTRAVENOUS
  Filled 2019-10-17: qty 2

## 2019-10-17 MED ORDER — ACETAMINOPHEN 500 MG PO TABS
ORAL_TABLET | ORAL | Status: AC
  Administered 2019-10-17: 1000 mg via ORAL
  Filled 2019-10-17: qty 2

## 2019-10-17 MED ORDER — FENTANYL CITRATE (PF) 100 MCG/2ML IJ SOLN
INTRAMUSCULAR | Status: DC | PRN
  Administered 2019-10-17 (×3): 50 ug via INTRAVENOUS

## 2019-10-17 MED ORDER — FENTANYL CITRATE (PF) 100 MCG/2ML IJ SOLN
INTRAMUSCULAR | Status: AC
  Filled 2019-10-17: qty 2

## 2019-10-17 MED ORDER — ACETAMINOPHEN 500 MG PO TABS: 1000.0000 mg | ORAL_TABLET | Freq: Four times a day (QID) | ORAL | 0 refills | Status: AC

## 2019-10-17 MED ORDER — GABAPENTIN 300 MG PO CAPS
ORAL_CAPSULE | ORAL | Status: AC
  Administered 2019-10-17: 09:00:00 300 mg via ORAL
  Filled 2019-10-17: qty 1

## 2019-10-17 MED ORDER — BUPIVACAINE HCL 0.5 % IJ SOLN
INTRAMUSCULAR | Status: DC | PRN
  Administered 2019-10-17: 7 mL

## 2019-10-17 MED ORDER — PROPOFOL 10 MG/ML IV BOLUS
INTRAVENOUS | Status: AC
  Filled 2019-10-17: qty 20

## 2019-10-17 MED ORDER — PHENYLEPHRINE HCL (PRESSORS) 10 MG/ML IV SOLN
INTRAVENOUS | Status: AC
  Filled 2019-10-17: qty 1

## 2019-10-17 MED ORDER — FENTANYL CITRATE (PF) 100 MCG/2ML IJ SOLN
25.0000 ug | INTRAMUSCULAR | Status: DC | PRN
  Administered 2019-10-17 (×2): 25 ug via INTRAVENOUS

## 2019-10-17 MED ORDER — ACETAMINOPHEN 500 MG PO TABS
1000.0000 mg | ORAL_TABLET | ORAL | Status: AC
  Administered 2019-10-17: 09:00:00 1000 mg via ORAL

## 2019-10-17 MED ORDER — LACTATED RINGERS IV SOLN: INTRAVENOUS | Status: DC

## 2019-10-17 MED ORDER — DOCUSATE SODIUM 100 MG PO CAPS: 100.0000 mg | ORAL_CAPSULE | Freq: Two times a day (BID) | ORAL | 0 refills | Status: DC

## 2019-10-17 MED ORDER — ONDANSETRON HCL 4 MG/2ML IJ SOLN
INTRAMUSCULAR | Status: AC
  Filled 2019-10-17: qty 2

## 2019-10-17 MED ORDER — SUGAMMADEX SODIUM 200 MG/2ML IV SOLN
INTRAVENOUS | Status: DC | PRN
  Administered 2019-10-17: 99.8 mg via INTRAVENOUS

## 2019-10-17 MED ORDER — LACTATED RINGERS IV SOLN
INTRAVENOUS | Status: DC
  Administered 2019-10-17: 09:00:00 via INTRAVENOUS

## 2019-10-17 MED ORDER — CELECOXIB 200 MG PO CAPS
400.0000 mg | ORAL_CAPSULE | ORAL | Status: AC
  Administered 2019-10-17: 09:00:00 400 mg via ORAL

## 2019-10-17 MED ORDER — ONDANSETRON HCL 4 MG/2ML IJ SOLN
INTRAMUSCULAR | Status: DC | PRN
  Administered 2019-10-17: 4 mg via INTRAVENOUS

## 2019-10-17 MED ORDER — PROPOFOL 10 MG/ML IV BOLUS
INTRAVENOUS | Status: DC | PRN
  Administered 2019-10-17: 100 mg via INTRAVENOUS

## 2019-10-17 MED ORDER — SUCCINYLCHOLINE CHLORIDE 20 MG/ML IJ SOLN
INTRAMUSCULAR | Status: DC | PRN
  Administered 2019-10-17: 80 mg via INTRAVENOUS

## 2019-10-17 MED ORDER — FAMOTIDINE 20 MG PO TABS
ORAL_TABLET | ORAL | Status: AC
  Administered 2019-10-17: 20 mg via ORAL
  Filled 2019-10-17: qty 1

## 2019-10-17 MED ORDER — DEXAMETHASONE SODIUM PHOSPHATE 10 MG/ML IJ SOLN
INTRAMUSCULAR | Status: AC
  Filled 2019-10-17: qty 1

## 2019-10-17 MED ORDER — ROCURONIUM BROMIDE 100 MG/10ML IV SOLN
INTRAVENOUS | Status: DC | PRN
  Administered 2019-10-17: 30 mg via INTRAVENOUS
  Administered 2019-10-17 (×2): 10 mg via INTRAVENOUS

## 2019-10-17 MED ORDER — CEFAZOLIN SODIUM-DEXTROSE 1-4 GM/50ML-% IV SOLN
INTRAVENOUS | Status: DC | PRN
  Administered 2019-10-17: 1 g via INTRAVENOUS

## 2019-10-17 MED ORDER — GABAPENTIN 300 MG PO CAPS
300.0000 mg | ORAL_CAPSULE | ORAL | Status: AC
  Administered 2019-10-17: 09:00:00 300 mg via ORAL

## 2019-10-17 MED ORDER — IBUPROFEN 800 MG PO TABS: 800.0000 mg | ORAL_TABLET | Freq: Three times a day (TID) | ORAL | 1 refills | Status: AC

## 2019-10-17 MED ORDER — PHENYLEPHRINE HCL (PRESSORS) 10 MG/ML IV SOLN
INTRAVENOUS | Status: DC | PRN
  Administered 2019-10-17 (×3): 100 ug via INTRAVENOUS
  Administered 2019-10-17: 50 ug via INTRAVENOUS
  Administered 2019-10-17 (×2): 100 ug via INTRAVENOUS

## 2019-10-17 MED ORDER — ONDANSETRON HCL 4 MG/2ML IJ SOLN: 4.0000 mg | Freq: Once | INTRAMUSCULAR | Status: DC | PRN

## 2019-10-17 MED ORDER — DEXAMETHASONE SODIUM PHOSPHATE 10 MG/ML IJ SOLN
INTRAMUSCULAR | Status: DC | PRN
  Administered 2019-10-17: 5 mg via INTRAVENOUS

## 2019-10-17 MED ORDER — HEMOSTATIC AGENTS (NO CHARGE) OPTIME
TOPICAL | Status: DC | PRN
  Administered 2019-10-17: 1 via TOPICAL

## 2019-10-17 MED ORDER — SUCCINYLCHOLINE CHLORIDE 20 MG/ML IJ SOLN
INTRAMUSCULAR | Status: AC
  Filled 2019-10-17: qty 1

## 2019-10-17 MED ORDER — MIDAZOLAM HCL 2 MG/2ML IJ SOLN
INTRAMUSCULAR | Status: AC
  Filled 2019-10-17: qty 2

## 2019-10-17 MED ORDER — ROCURONIUM BROMIDE 50 MG/5ML IV SOLN
INTRAVENOUS | Status: AC
  Filled 2019-10-17: qty 1

## 2019-10-17 MED ORDER — LIDOCAINE HCL (CARDIAC) PF 100 MG/5ML IV SOSY
PREFILLED_SYRINGE | INTRAVENOUS | Status: DC | PRN
  Administered 2019-10-17: 100 mg via INTRAVENOUS

## 2019-10-17 MED ORDER — GABAPENTIN 800 MG PO TABS: 800.0000 mg | ORAL_TABLET | Freq: Every day | ORAL | 0 refills | Status: DC

## 2019-10-17 MED ORDER — FAMOTIDINE 20 MG PO TABS
20.0000 mg | ORAL_TABLET | Freq: Once | ORAL | Status: AC
  Administered 2019-10-17: 09:00:00 20 mg via ORAL

## 2019-10-17 MED ORDER — SUGAMMADEX SODIUM 200 MG/2ML IV SOLN
INTRAVENOUS | Status: AC
  Filled 2019-10-17: qty 2

## 2019-10-17 MED ORDER — MIDAZOLAM HCL 2 MG/2ML IJ SOLN
INTRAMUSCULAR | Status: DC | PRN
  Administered 2019-10-17: 2 mg via INTRAVENOUS

## 2019-10-17 MED ORDER — CEFAZOLIN SODIUM 1 G IJ SOLR
INTRAMUSCULAR | Status: AC
  Filled 2019-10-17: qty 10

## 2019-10-17 MED ORDER — CELECOXIB 200 MG PO CAPS
ORAL_CAPSULE | ORAL | Status: AC
  Administered 2019-10-17: 400 mg via ORAL
  Filled 2019-10-17: qty 2

## 2019-10-17 MED ORDER — OXYCODONE HCL 5 MG PO CAPS: 5.0000 mg | ORAL_CAPSULE | Freq: Four times a day (QID) | ORAL | 0 refills | Status: DC | PRN

## 2019-10-17 MED ORDER — LIDOCAINE HCL (PF) 2 % IJ SOLN
INTRAMUSCULAR | Status: AC
  Filled 2019-10-17: qty 10

## 2019-10-17 SURGICAL SUPPLY — 53 items
BAG URINE DRAIN 2000ML AR STRL (UROLOGICAL SUPPLIES) ×3 IMPLANT
BLADE SURG SZ11 CARB STEEL (BLADE) ×3 IMPLANT
CATH FOLEY 2WAY  5CC 16FR (CATHETERS) ×1
CATH URTH 16FR FL 2W BLN LF (CATHETERS) ×2 IMPLANT
CHLORAPREP W/TINT 26 (MISCELLANEOUS) ×3 IMPLANT
COVER WAND RF STERILE (DRAPES) IMPLANT
DERMABOND ADVANCED (GAUZE/BANDAGES/DRESSINGS) ×1
DERMABOND ADVANCED .7 DNX12 (GAUZE/BANDAGES/DRESSINGS) ×2 IMPLANT
DRAPE GENERAL ENDO 106X123.5 (DRAPES) ×3 IMPLANT
DRAPE LEGGINS SURG 28X43 STRL (DRAPES) ×3 IMPLANT
DRAPE STERI POUCH LG 24X46 STR (DRAPES) IMPLANT
DRAPE UNDER BUTTOCK W/FLU (DRAPES) ×3 IMPLANT
DRESSING SURGICEL FIBRLLR 1X2 (HEMOSTASIS) IMPLANT
DRSG SURGICEL FIBRILLAR 1X2 (HEMOSTASIS) ×3
GLOVE BIO SURGEON STRL SZ 6.5 (GLOVE) ×2 IMPLANT
GLOVE BIO SURGEON STRL SZ7 (GLOVE) ×7 IMPLANT
GLOVE INDICATOR 7.5 STRL GRN (GLOVE) ×4 IMPLANT
GLOVE PI ORTHOPRO 6.5 (GLOVE) ×1
GLOVE PI ORTHOPRO STRL 6.5 (GLOVE) IMPLANT
GOWN STRL REUS W/ TWL LRG LVL3 (GOWN DISPOSABLE) ×4 IMPLANT
GOWN STRL REUS W/TWL LRG LVL3 (GOWN DISPOSABLE) ×3
IRRIGATION STRYKERFLOW (MISCELLANEOUS) ×2 IMPLANT
IRRIGATOR STRYKERFLOW (MISCELLANEOUS) ×3
IV NS 1000ML (IV SOLUTION) ×1
IV NS 1000ML BAXH (IV SOLUTION) ×2 IMPLANT
KIT PINK PAD W/HEAD ARE REST (MISCELLANEOUS) ×3
KIT PINK PAD W/HEAD ARM REST (MISCELLANEOUS) ×2 IMPLANT
KIT TURNOVER CYSTO (KITS) ×3 IMPLANT
LABEL OR SOLS (LABEL) ×3 IMPLANT
LIGASURE LAP MARYLAND 5MM 37CM (ELECTROSURGICAL) IMPLANT
LIGASURE VESSEL 5MM BLUNT TIP (ELECTROSURGICAL) IMPLANT
NDL FILTER BLUNT 18X1 1/2 (NEEDLE) ×2 IMPLANT
NEEDLE FILTER BLUNT 18X 1/2SAF (NEEDLE) ×1
NEEDLE FILTER BLUNT 18X1 1/2 (NEEDLE) ×2 IMPLANT
NS IRRIG 500ML POUR BTL (IV SOLUTION) ×3 IMPLANT
PACK GYN LAPAROSCOPIC (MISCELLANEOUS) ×3 IMPLANT
PAD OB MATERNITY 4.3X12.25 (PERSONAL CARE ITEMS) ×3 IMPLANT
PAD PREP 24X41 OB/GYN DISP (PERSONAL CARE ITEMS) ×3 IMPLANT
POUCH SPECIMEN RETRIEVAL 10MM (ENDOMECHANICALS) ×1 IMPLANT
SCISSORS METZENBAUM CVD 33 (INSTRUMENTS) IMPLANT
SET TUBE SMOKE EVAC HIGH FLOW (TUBING) ×3 IMPLANT
SLEEVE ENDOPATH XCEL 5M (ENDOMECHANICALS) ×3 IMPLANT
STRIP CLOSURE SKIN 1/4X4 (GAUZE/BANDAGES/DRESSINGS) ×3 IMPLANT
SUT MNCRL AB 4-0 PS2 18 (SUTURE) ×3 IMPLANT
SUT VIC AB 2-0 CT1 27 (SUTURE) ×1
SUT VIC AB 2-0 CT1 TAPERPNT 27 (SUTURE) IMPLANT
SUT VIC AB 2-0 UR6 27 (SUTURE) ×3 IMPLANT
SUT VIC AB 4-0 SH 27 (SUTURE) ×1
SUT VIC AB 4-0 SH 27XANBCTRL (SUTURE) ×2 IMPLANT
SYR 50ML LL SCALE MARK (SYRINGE) IMPLANT
SYR 5ML LL (SYRINGE) ×3 IMPLANT
TROCAR XCEL NON-BLD 5MMX100MML (ENDOMECHANICALS) ×3 IMPLANT
TUBING ART PRESS 48 MALE/FEM (TUBING) IMPLANT

## 2019-10-17 NOTE — Anesthesia Postprocedure Evaluation (Signed)
Anesthesia Post Note  Patient: Alicia Huang  Procedure(s) Performed: LAPAROSCOPY OPERATIVE (N/A ) LAPAROSCOPIC OVARIAN CYSTECTOMY (Right )  Patient location during evaluation: PACU Anesthesia Type: General Level of consciousness: awake and alert Pain management: pain level controlled Vital Signs Assessment: post-procedure vital signs reviewed and stable Respiratory status: spontaneous breathing, nonlabored ventilation, respiratory function stable and patient connected to nasal cannula oxygen Cardiovascular status: blood pressure returned to baseline and stable Postop Assessment: no apparent nausea or vomiting Anesthetic complications: no     Last Vitals:  Vitals:   10/17/19 1313 10/17/19 1328  BP: (!) 92/55 (!) 101/57  Pulse: 63 60  Resp: 12 (!) 9  Temp: (!) 36.3 C   SpO2: 100% 100%    Last Pain:  Vitals:   10/17/19 0844  TempSrc: Temporal  PainSc: 0-No pain                 Andersyn Fragoso S

## 2019-10-17 NOTE — Op Note (Signed)
Alicia Huang PROCEDURE DATE: 10/17/2019  PREOPERATIVE DIAGNOSIS: Ovarian cyst, suspect dermoid POSTOPERATIVE DIAGNOSIS: same PROCEDURE: LAPAROSCOPY OPERATIVE:  LAPAROSCOPIC OVARIAN CYSTECTOMY:   SURGEON:  Dr. Benjaman Kindler ASSISTANT: Dr. Vikki Ports Ward  ANESTHESIOLOGIST: Gunnar Bulla, MD Anesthesiologist: Gunnar Bulla, MD CRNA: Lowry Bowl, CRNA; Caryl Asp, CRNA  INDICATIONS: 43 y.o. F with history of suspected 4cm right ovarian dermoid cyst, desiring surgical evaluation.   Please see preoperative notes for further details.  Risks of surgery were discussed with the patient including but not limited to: bleeding which may require transfusion or reoperation; infection which may require antibiotics; injury to bowel, bladder, ureters or other surrounding organs; need for additional procedures including laparotomy; thromboembolic phenomenon, incisional problems and other postoperative/anesthesia complications. Written informed consent was obtained.    FINDINGS:  Fibroid uterus, normal left ovary and fallopian tubes bilaterally.  Enlarged right ovary with ovarian cyst noted. No other abdominal/pelvic abnormality.  Normal upper abdomen. Normal appendix  ANESTHESIA:    General INTRAVENOUS FLUIDS: 800 ml ESTIMATED BLOOD LOSS: 100 ml URINE OUTPUT: 1200 ml SPECIMENS: Right ovarian cyst COMPLICATIONS: None immediate  PROCEDURE IN DETAIL:  The patient had sequential compression devices applied to her lower extremities while in the preoperative area.  She was then taken to the operating room where general anesthesia was administered and was found to be adequate.  She was placed in the dorsal lithotomy position, and was prepped and draped in a sterile manner.  A Foley catheter was inserted into her bladder and attached to constant drainage and a uterine manipulator was then advanced into the uterus .  After an adequate timeout was performed, attention was turned to the abdomen where an  umbilical incision was made with the scalpel.  The Optiview 5-mm trocar and sleeve were then advanced without difficulty with the laparoscope under direct visualization into the abdomen.  The abdomen was then insufflated with carbon dioxide gas and adequate pneumoperitoneum was obtained.   A detailed survey of the patient's pelvis and abdomen revealed the findings as mentioned above.  An additional 71mm trocar was placed in the bilateral lower quadrants under direct visualization. A 10 mm incision was made in the posterior culdesac, large enough to admit a 22mm endocatch bag into the pelvis.  Evaluation of the pelvic sidewalls to observe the course of the ureters was undertaken, assuring the ureter was outside the operative field.   Right ovarian cystectomy The ovary was placed inside the endocatch bag. The ovarian cyst was evaluated and dissected out from the normal ovarian tissue. Hemostasis was assured with electrocautery. The pelvis was irrigated and all fluid and blood removed. The patient was placed in reverse Trendelenburg and all fluid removed.  The operative site was surveyed, and it was found to be hemostatic after placement of fibular and cautery.  No intraoperative injury to surrounding organs was noted.   Pictures were taken of the quadrants and pelvis. The abdomen was desufflated and all instruments were then removed from the patient's abdomen. The uterine manipulator was removed without complications.  All incisions were closed with 4-0 Vicryl and Dermabond.   The patient tolerated the procedures well.  All instruments, needles, and sponge counts were correct x 2. The patient was taken to the recovery room in stable condition.

## 2019-10-17 NOTE — Anesthesia Preprocedure Evaluation (Signed)
Anesthesia Evaluation  Patient identified by MRN, date of birth, ID band Patient awake    Reviewed: Allergy & Precautions, NPO status , Patient's Chart, lab work & pertinent test results, reviewed documented beta blocker date and time   Airway Mallampati: II  TM Distance: >3 FB     Dental  (+) Chipped   Pulmonary           Cardiovascular      Neuro/Psych    GI/Hepatic   Endo/Other    Renal/GU      Musculoskeletal   Abdominal   Peds  Hematology  (+) anemia ,   Anesthesia Other Findings   Reproductive/Obstetrics                             Anesthesia Physical Anesthesia Plan  ASA: II  Anesthesia Plan: General   Post-op Pain Management:    Induction: Intravenous  PONV Risk Score and Plan:   Airway Management Planned: Oral ETT  Additional Equipment:   Intra-op Plan:   Post-operative Plan:   Informed Consent: I have reviewed the patients History and Physical, chart, labs and discussed the procedure including the risks, benefits and alternatives for the proposed anesthesia with the patient or authorized representative who has indicated his/her understanding and acceptance.       Plan Discussed with: CRNA  Anesthesia Plan Comments:         Anesthesia Quick Evaluation

## 2019-10-17 NOTE — Transfer of Care (Signed)
Immediate Anesthesia Transfer of Care Note  Patient: Alicia Huang  Procedure(s) Performed: LAPAROSCOPY OPERATIVE (N/A ) LAPAROSCOPIC OVARIAN CYSTECTOMY (Right )  Patient Location: PACU  Anesthesia Type:General  Level of Consciousness: sedated  Airway & Oxygen Therapy: Patient Spontanous Breathing and Patient connected to face mask oxygen  Post-op Assessment: Report given to RN and Post -op Vital signs reviewed and stable  Post vital signs: Reviewed and stable  Last Vitals:  Vitals Value Taken Time  BP 92/55 10/17/19 1313  Temp 36.3 C 10/17/19 1313  Pulse 60 10/17/19 1315  Resp 9 10/17/19 1315  SpO2 100 % 10/17/19 1315  Vitals shown include unvalidated device data.  Last Pain:  Vitals:   10/17/19 0844  TempSrc: Temporal  PainSc: 0-No pain         Complications: No apparent anesthesia complications

## 2019-10-17 NOTE — Discharge Instructions (Signed)
Laparoscopic Ovarian Surgery Discharge Instructions  For the next three days, take ibuprofen and acetaminophen on a schedule, every 8 hours. You can take them together or you can intersperse them, and take one every four hours. I also gave you gabapentin for nighttime, to help you sleep and also to control pain. Take gabapentin medicines at night for at least the next 3 nights. You also have a narcotic, oxycodone, to take as needed if the above medicines don't help.  Postop constipation is a major cause of pain. Stay well hydrated, walk as you tolerate, and take over the counter senna as well as stool softeners if you need them.   RISKS AND COMPLICATIONS   Infection.  Bleeding.  Injury to surrounding organs.  Anesthetic side effects.   PROCEDURE   You may be given a medicine to help you relax (sedative) before the procedure. You will be given a medicine to make you sleep (general anesthetic) during the procedure.  A tube will be put down your throat to help your breath while under general anesthesia.  Several small cuts (incisions) are made in the lower abdominal area and one incision is made near the belly button.  Your abdominal area will be inflated with a safe gas (carbon dioxide). This helps give the surgeon room to operate, visualize, and helps the surgeon avoid other organs.  A thin, lighted tube (laparoscope) with a camera attached is inserted into your abdomen through the incision near the belly button. Other small instruments may also be inserted through other abdominal incisions.  The ovary is located and are removed.  After the ovary is removed, the gas is released from the abdomen.  The incisions will be closed with stitches (sutures), and Dermabond. A bandage may be placed over the incisions.  AFTER THE PROCEDURE   You will also have some mild abdominal discomfort for 3-7 days. You will be given pain medicine to ease any discomfort.  As long as there are no  problems, you may be allowed to go home. Someone will need to drive you home and be with you for at least 24 hours once home.  You may have some mild discomfort in the throat. This is from the tube placed in your throat while you were sleeping.  You may experience discomfort in the shoulder area from some trapped air between the liver and diaphragm. This sensation is normal and will slowly go away on its own.  HOME CARE INSTRUCTIONS   Take all medicines as directed.  Only take over-the-counter or prescription medicines for pain, discomfort, or fever as directed by your caregiver.  Resume daily activities as directed.  Showers are preferred over baths for 2 weeks.  You may resume sexual activities in 1 week or as you feel you would like to.  Do not drive while taking narcotics.  SEEK MEDICAL CARE IF: .  There is increasing abdominal pain.  You feel lightheaded or faint.  You have the chills.  You have an oral temperature above 102 F (38.9 C).  There is pus-like (purulent) drainage from any of the wounds.  You are unable to pass gas or have a bowel movement.  You feel sick to your stomach (nauseous) or throw up (vomit) and can't control it with your medicines.  MAKE SURE YOU:   Understand these instructions.  Will watch your condition.  Will get help right away if you are not doing well or get worse.  ExitCare Patient Information 2013 [employer]     AMBULATORY SURGERY  DISCHARGE INSTRUCTIONS   1) The drugs that you were given will stay in your system until tomorrow so for the next 24 hours you should not:  A) Drive an automobile B) Make any legal decisions C) Drink any alcoholic beverage   2) You may resume regular meals tomorrow.  Today it is better to start with liquids and gradually work up to solid foods.  You may eat anything you prefer, but it is better to start with liquids, then soup and crackers, and gradually work up to solid  foods.   3) Please notify your doctor immediately if you have any unusual bleeding, trouble breathing, redness and pain at the surgery site, drainage, fever, or pain not relieved by medication.    4) Additional Instructions:        Please contact your physician with any problems or Same Day Surgery at [phone], Monday through Friday 6 am to 4 pm, or [facility] number at [phone].   

## 2019-10-17 NOTE — Anesthesia Procedure Notes (Addendum)
Procedure Name: Intubation Date/Time: 10/17/2019 10:47 AM Performed by: Caryl Asp, CRNA Pre-anesthesia Checklist: Patient identified, Patient being monitored, Timeout performed, Emergency Drugs available and Suction available Patient Re-evaluated:Patient Re-evaluated prior to induction Oxygen Delivery Method: Circle system utilized Preoxygenation: Pre-oxygenation with 100% oxygen Induction Type: IV induction Ventilation: Mask ventilation without difficulty Laryngoscope Size: 3 and McGraph Grade View: Grade I Tube type: Oral Tube size: 7.0 mm Number of attempts: 2 Airway Equipment and Method: Stylet Placement Confirmation: ETT inserted through vocal cords under direct vision,  positive ETCO2 and breath sounds checked- equal and bilateral Secured at: 21 cm Tube secured with: Tape Dental Injury: Teeth and Oropharynx as per pre-operative assessment  Difficulty Due To: Difficulty was unanticipated, Difficult Airway- due to anterior larynx and Difficult Airway- due to limited oral opening Comments: First attempt with MAC 3

## 2019-10-17 NOTE — Anesthesia Post-op Follow-up Note (Signed)
Anesthesia QCDR form completed.        

## 2019-10-17 NOTE — Interval H&P Note (Signed)
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  The various methods of treatment have been discussed with the patient and family. After consideration of risks, benefits and other options for treatment, the patient has consented to  Procedure(s): LAPAROSCOPY DIAGNOSTIC (N/A) LAPAROSCOPIC OVARIAN CYSTECTOMY (Left) as a surgical intervention.  The patient's history has been reviewed, patient examined, no change in status, stable for surgery.  I have reviewed the patient's chart and labs.  Questions were answered to the patient's satisfaction.     Benjaman Kindler

## 2019-10-18 ENCOUNTER — Encounter: Payer: 59 | Admitting: Internal Medicine

## 2019-10-18 ENCOUNTER — Encounter: Payer: Self-pay | Admitting: Obstetrics and Gynecology

## 2019-10-21 ENCOUNTER — Encounter: Payer: Self-pay | Admitting: Internal Medicine

## 2019-10-21 DIAGNOSIS — D270 Benign neoplasm of right ovary: Secondary | ICD-10-CM | POA: Insufficient documentation

## 2019-10-21 LAB — SURGICAL PATHOLOGY

## 2019-10-24 ENCOUNTER — Telehealth: Payer: Self-pay

## 2019-10-24 NOTE — Telephone Encounter (Signed)
Lm to call back

## 2019-10-24 NOTE — Telephone Encounter (Signed)
Copied from [place] [phone]. Topic: General - Other >> Oct 24, 2019  8:57 AM Carolyn Stare wrote: Pt ask if Dr Nicki Reaper is doing any CPE virtual she has a CPE schedule for Jan 2021

## 2019-12-13 ENCOUNTER — Telehealth: Payer: Self-pay | Admitting: Internal Medicine

## 2019-12-13 NOTE — Telephone Encounter (Signed)
I have called pt twice to try to set up a Doxy appt for 12/16/19 With no return call

## 2019-12-15 NOTE — Telephone Encounter (Signed)
Appt will be virtual. Patient is aware/

## 2019-12-16 ENCOUNTER — Other Ambulatory Visit: Payer: Self-pay

## 2019-12-16 ENCOUNTER — Encounter: Payer: Self-pay | Admitting: Internal Medicine

## 2019-12-16 ENCOUNTER — Ambulatory Visit (INDEPENDENT_AMBULATORY_CARE_PROVIDER_SITE_OTHER): Payer: 59 | Admitting: Internal Medicine

## 2019-12-16 VITALS — Ht 61.0 in | Wt 110.0 lb

## 2019-12-16 DIAGNOSIS — Z8742 Personal history of other diseases of the female genital tract: Secondary | ICD-10-CM | POA: Diagnosis not present

## 2019-12-16 DIAGNOSIS — D270 Benign neoplasm of right ovary: Secondary | ICD-10-CM | POA: Diagnosis not present

## 2019-12-16 DIAGNOSIS — Z0000 Encounter for general adult medical examination without abnormal findings: Secondary | ICD-10-CM

## 2019-12-16 DIAGNOSIS — E559 Vitamin D deficiency, unspecified: Secondary | ICD-10-CM | POA: Diagnosis not present

## 2019-12-16 DIAGNOSIS — Z862 Personal history of diseases of the blood and blood-forming organs and certain disorders involving the immune mechanism: Secondary | ICD-10-CM | POA: Diagnosis not present

## 2019-12-16 DIAGNOSIS — Z1231 Encounter for screening mammogram for malignant neoplasm of breast: Secondary | ICD-10-CM | POA: Diagnosis not present

## 2019-12-16 DIAGNOSIS — Z13220 Encounter for screening for lipoid disorders: Secondary | ICD-10-CM

## 2019-12-16 NOTE — Progress Notes (Signed)
Patient ID: Alicia Huang, female   DOB: 31-Aug-1976, 44 y.o.   MRN: [id number]   Virtual Visit via video Note  This visit type was conducted due to national recommendations for restrictions regarding the COVID-19 pandemic (e.g. social distancing).  This format is felt to be most appropriate for this patient at this time.  All issues noted in this document were discussed and addressed.  No physical exam was performed (except for noted visual exam findings with Video Visits).   I connected with [facility] by a video enabled telemedicine application and verified that I am speaking with the correct person using two identifiers. Location patient: home Location provider: work Persons participating in the virtual visit: patient, provider  The limitations, risks, security and privacy concerns of performing an evaluation and management service by video and the availability of in person appointments have been discussed.   The patient expressed understanding and agreed to proceed.   Reason for visit: physical   HPI: She presents for a scheduled physical.  Saw gyn in 07/2019 for gyn exam.  Is s/p lap ovarian cystectomy.  Doing well s/p surgery.  No residual pain or problems from her surgery.  Stays active.  Exercises.  Is doing yoga.  Trying to stay in due to covid restrictions.  Painting more.  Doing more creative things.  No problems with allergies.  No chest congestion or sob reported.  No abdominal pain reported.  Overall feels good.  Discussed mammogram.     ROS: See pertinent positives and negatives per HPI.  Past Medical History:  Diagnosis Date  . Anemia   . History of abnormal cervical Pap smear     Past Surgical History:  Procedure Laterality Date  . colpolscopy    . LAPAROSCOPIC OVARIAN CYSTECTOMY Right 10/17/2019   Procedure: LAPAROSCOPIC OVARIAN CYSTECTOMY;  Surgeon: Benjaman Kindler, MD;  Location: [facility];  Service: Gynecology;  Laterality: Right;  . LAPAROSCOPY N/A 10/17/2019     Procedure: LAPAROSCOPY OPERATIVE;  Surgeon: Benjaman Kindler, MD;  Location: [facility];  Service: Gynecology;  Laterality: N/A;  . NO PAST SURGERIES      Family History  Problem Relation Age of Onset  . Hyperlipidemia Mother   . Hyperlipidemia Father   . Hypertension Father   . Breast cancer Neg Hx     SOCIAL HX: reviewed.    Current Outpatient Medications:  .  cholecalciferol (VITAMIN D3) 25 MCG (1000 UT) tablet, Take 1,000 Units by mouth daily., Disp: , Rfl:  .  COLLAGEN PO, Take 1 Scoop by mouth daily., Disp: , Rfl:  .  docusate sodium (COLACE) 100 MG capsule, Take 1 capsule (100 mg total) by mouth 2 (two) times daily. To keep stools soft, Disp: 30 capsule, Rfl: 0 .  ELDERBERRY PO, Take 1 capsule by mouth daily., Disp: , Rfl:  .  ferrous sulfate 300 (60 Fe) MG/5ML syrup, Take 300 mg by mouth daily with breakfast. , Disp: , Rfl:  .  INOSITOL PO, Take 1 tablet by mouth as needed., Disp: , Rfl:  .  levonorgestrel (MIRENA) 20 MCG/24HR IUD, by Intrauterine route., Disp: , Rfl:  .  Multiple Vitamins-Minerals (IMMUNE SUPPORT PO), Take 1 Scoop by mouth daily., Disp: , Rfl:  .  vitamin B-12 (CYANOCOBALAMIN) 500 MCG tablet, Take 500 mcg by mouth daily., Disp: , Rfl:   EXAM:  GENERAL: alert, oriented, appears well and in no acute distress  HEENT: atraumatic, conjunttiva clear, no obvious abnormalities on inspection of external nose and ears  NECK: normal  movements of the head and neck  LUNGS: on inspection no signs of respiratory distress, breathing rate appears normal, no obvious gross SOB, gasping or wheezing  CV: no obvious cyanosis  PSYCH/NEURO: pleasant and cooperative, no obvious depression or anxiety, speech and thought processing grossly intact  ASSESSMENT AND PLAN:  Discussed the following assessment and plan:  Healthcare maintenance Virtual physical today 12/16/19.  Sees gyn for pelvic and pap smears.  Schedule mammogram.  Discussed colonoscopy.    History of  abnormal cervical Pap smear Followed by gyn.  Up to date.    History of anemia Follow cbc and ferritin.   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  Followed by Dr Leafy Ro.   Vitamin D deficiency Follow vitamin D level.    Orders Placed This Encounter  Procedures  . MM 3D SCREEN BREAST BILATERAL    Standing Status:   Future    Standing Expiration Date:   02/12/2021    Scheduling Instructions:     Pt prefers Friday pm after 1:00.    Order Specific Question:   Reason for Exam (SYMPTOM  OR DIAGNOSIS REQUIRED)    Answer:   breast cancer screening    Order Specific Question:   Is the patient pregnant?    Answer:   No    Order Specific Question:   Preferred imaging location?    Answer:   [facility]  . CBC with Differential/Platelet    Standing Status:   Future    Standing Expiration Date:   12/15/2020  . Comprehensive metabolic panel    Standing Status:   Future    Standing Expiration Date:   12/15/2020  . Lipid panel    Standing Status:   Future    Standing Expiration Date:   12/15/2020  . TSH    Standing Status:   Future    Standing Expiration Date:   12/15/2020  . VITAMIN D 25 Hydroxy (Vit-D Deficiency, Fractures)    Standing Status:   Future    Standing Expiration Date:   12/15/2020     I discussed the assessment and treatment plan with the patient. The patient was provided an opportunity to ask questions and all were answered. The patient agreed with the plan and demonstrated an understanding of the instructions.   The patient was advised to call back or seek an in-person evaluation if the symptoms worsen or if the condition fails to improve as anticipated.   Einar Pheasant, MD

## 2019-12-18 ENCOUNTER — Encounter: Payer: Self-pay | Admitting: Internal Medicine

## 2019-12-18 NOTE — Assessment & Plan Note (Signed)
Virtual physical today 12/16/19.  Sees gyn for pelvic and pap smears.  Schedule mammogram.  Discussed colonoscopy.

## 2019-12-18 NOTE — Assessment & Plan Note (Signed)
Follow cbc and ferritin.  

## 2019-12-18 NOTE — Assessment & Plan Note (Signed)
Followed by gyn.  Up to date.   ?

## 2019-12-18 NOTE — Assessment & Plan Note (Signed)
Follow vitamin D level.  

## 2019-12-18 NOTE — Assessment & Plan Note (Signed)
S/p cystectomy 09/2019.  Followed by Dr Leafy Ro.

## 2019-12-19 ENCOUNTER — Telehealth: Payer: Self-pay | Admitting: Internal Medicine

## 2019-12-19 NOTE — Telephone Encounter (Signed)
LMTCB and schedule a fasting lab in 1-2wks and a cpe in 1year

## 2019-12-21 ENCOUNTER — Encounter: Payer: Self-pay | Admitting: Internal Medicine

## 2019-12-28 ENCOUNTER — Encounter: Payer: Self-pay | Admitting: Internal Medicine

## 2020-01-06 ENCOUNTER — Other Ambulatory Visit: Payer: Self-pay

## 2020-01-09 ENCOUNTER — Other Ambulatory Visit: Payer: Self-pay

## 2020-01-09 ENCOUNTER — Other Ambulatory Visit (INDEPENDENT_AMBULATORY_CARE_PROVIDER_SITE_OTHER): Payer: 59

## 2020-01-09 DIAGNOSIS — E559 Vitamin D deficiency, unspecified: Secondary | ICD-10-CM | POA: Diagnosis not present

## 2020-01-09 DIAGNOSIS — Z13220 Encounter for screening for lipoid disorders: Secondary | ICD-10-CM | POA: Diagnosis not present

## 2020-01-09 DIAGNOSIS — Z862 Personal history of diseases of the blood and blood-forming organs and certain disorders involving the immune mechanism: Secondary | ICD-10-CM

## 2020-01-09 LAB — COMPREHENSIVE METABOLIC PANEL
ALT: 9 U/L (ref 0–35)
AST: 12 U/L (ref 0–37)
Albumin: 4.5 g/dL (ref 3.5–5.2)
Alkaline Phosphatase: 66 U/L (ref 39–117)
BUN: 16 mg/dL (ref 6–23)
CO2: 28 mEq/L (ref 19–32)
Calcium: 9.7 mg/dL (ref 8.4–10.5)
Chloride: 100 mEq/L (ref 96–112)
Creatinine, Ser: 0.67 mg/dL (ref 0.40–1.20)
GFR: 96.21 mL/min (ref >60.00)
Glucose, Bld: 87 mg/dL (ref 70–99)
Potassium: 4 mEq/L (ref 3.5–5.1)
Sodium: 135 mEq/L (ref 135–145)
Total Bilirubin: 0.5 mg/dL (ref 0.2–1.2)
Total Protein: 7.6 g/dL (ref 6.0–8.3)

## 2020-01-09 LAB — TSH: TSH: 3.39 u[IU]/mL (ref 0.35–4.50)

## 2020-01-09 LAB — CBC WITH DIFFERENTIAL/PLATELET
Basophils Absolute: 0.1 10*3/uL (ref 0.0–0.1)
Basophils Relative: 1.5 % (ref 0.0–3.0)
Eosinophils Absolute: 0.4 10*3/uL (ref 0.0–0.7)
Eosinophils Relative: 9.7 % — ABNORMAL HIGH (ref 0.0–5.0)
HCT: 43 % (ref 36.0–46.0)
Hemoglobin: 14.4 g/dL (ref 12.0–15.0)
Lymphocytes Relative: 44.6 % (ref 12.0–46.0)
Lymphs Abs: 2 10*3/uL (ref 0.7–4.0)
MCHC: 33.5 g/dL (ref 30.0–36.0)
MCV: 93 fl (ref 78.0–100.0)
Monocytes Absolute: 0.3 10*3/uL (ref 0.1–1.0)
Monocytes Relative: 7.5 % (ref 3.0–12.0)
Neutro Abs: 1.6 10*3/uL (ref 1.4–7.7)
Neutrophils Relative %: 36.7 % — ABNORMAL LOW (ref 43.0–77.0)
Platelets: 260 10*3/uL (ref 150.0–400.0)
RBC: 4.62 Mil/uL (ref 3.87–5.11)
RDW: 12 % (ref 11.5–15.5)
WBC: 4.5 10*3/uL (ref 4.0–10.5)

## 2020-01-09 LAB — LIPID PANEL
Cholesterol: 238 mg/dL — ABNORMAL HIGH (ref 0–200)
HDL: 98.1 mg/dL (ref >39.00)
LDL Cholesterol: 123 mg/dL — ABNORMAL HIGH (ref 0–99)
NonHDL: 139.66
Total CHOL/HDL Ratio: 2
Triglycerides: 81 mg/dL (ref 0.0–149.0)
VLDL: 16.2 mg/dL (ref 0.0–40.0)

## 2020-01-09 LAB — VITAMIN D 25 HYDROXY (VIT D DEFICIENCY, FRACTURES): VITD: 71.37 ng/mL (ref 30.00–100.00)

## 2020-01-10 ENCOUNTER — Encounter: Payer: Self-pay | Admitting: Internal Medicine

## 2020-01-16 ENCOUNTER — Other Ambulatory Visit: Payer: Self-pay | Admitting: Internal Medicine

## 2020-01-16 DIAGNOSIS — Z1231 Encounter for screening mammogram for malignant neoplasm of breast: Secondary | ICD-10-CM

## 2020-01-16 NOTE — Progress Notes (Signed)
Orders placed for diagnostic mammogram.

## 2020-02-10 ENCOUNTER — Other Ambulatory Visit: Payer: 59

## 2020-02-17 ENCOUNTER — Ambulatory Visit
Admission: RE | Admit: 2020-02-17 | Discharge: 2020-02-17 | Disposition: A | Discharge status: Home / Self Care | Payer: 59 | Source: Ambulatory Visit | Attending: Internal Medicine | Admitting: Internal Medicine

## 2020-02-17 DIAGNOSIS — Z1231 Encounter for screening mammogram for malignant neoplasm of breast: Secondary | ICD-10-CM | POA: Insufficient documentation

## 2020-02-17 DIAGNOSIS — R922 Inconclusive mammogram: Secondary | ICD-10-CM | POA: Diagnosis not present

## 2020-06-15 DIAGNOSIS — L578 Other skin changes due to chronic exposure to nonionizing radiation: Secondary | ICD-10-CM | POA: Diagnosis not present

## 2020-10-03 DIAGNOSIS — Z01419 Encounter for gynecological examination (general) (routine) without abnormal findings: Secondary | ICD-10-CM | POA: Diagnosis not present

## 2020-10-24 ENCOUNTER — Other Ambulatory Visit: Payer: Self-pay

## 2020-10-29 ENCOUNTER — Encounter: Payer: Self-pay | Admitting: Internal Medicine

## 2020-10-29 ENCOUNTER — Other Ambulatory Visit: Payer: Self-pay

## 2020-10-29 ENCOUNTER — Ambulatory Visit: Payer: 59 | Admitting: Internal Medicine

## 2020-10-29 DIAGNOSIS — Z8742 Personal history of other diseases of the female genital tract: Secondary | ICD-10-CM

## 2020-10-29 DIAGNOSIS — Z862 Personal history of diseases of the blood and blood-forming organs and certain disorders involving the immune mechanism: Secondary | ICD-10-CM

## 2020-10-29 DIAGNOSIS — R221 Localized swelling, mass and lump, neck: Secondary | ICD-10-CM

## 2020-10-29 MED ORDER — MUPIROCIN 2 % EX OINT: 1.0000 "application " | TOPICAL_OINTMENT | Freq: Two times a day (BID) | CUTANEOUS | 0 refills | Status: DC

## 2020-10-29 NOTE — Progress Notes (Signed)
Patient ID: Alicia Huang, female   DOB: December 30, 1975, 44 y.o.   MRN: [id number]   Subjective:    Patient ID: Alicia Huang, female    DOB: 06-Oct-1976, 44 y.o.   MRN: [id number]  HPI This visit occurred during the SARS-CoV-2 public health emergency.  Safety protocols were in place, including screening questions prior to the visit, additional usage of staff PPE, and extensive cleaning of exam room while observing appropriate contact time as indicated for disinfecting solutions.  Patient here for work in appt. Concern regarding "lump" on neck.  No increased pain.  No increased warmth or erythema.  No fever.  Doing well otherwise.  Stays active.  Exercises.  Sees gyn.  Just had wellness visit - Dr Leafy Ro.  Increased stress - father passed.  Has good support.  Does not feel needs anything more at this time.    Past Medical History:  Diagnosis Date  . Anemia   . History of abnormal cervical Pap smear    Past Surgical History:  Procedure Laterality Date  . colpolscopy    . LAPAROSCOPIC OVARIAN CYSTECTOMY Right 10/17/2019   Procedure: LAPAROSCOPIC OVARIAN CYSTECTOMY;  Surgeon: Benjaman Kindler, MD;  Location: [facility];  Service: Gynecology;  Laterality: Right;  . LAPAROSCOPY N/A 10/17/2019   Procedure: LAPAROSCOPY OPERATIVE;  Surgeon: Benjaman Kindler, MD;  Location: [facility];  Service: Gynecology;  Laterality: N/A;  . NO PAST SURGERIES     Family History  Problem Relation Age of Onset  . Hyperlipidemia Mother   . Hyperlipidemia Father   . Hypertension Father   . Cancer Father   . Breast cancer Neg Hx    Social History   Socioeconomic History  . Marital status: Unknown    Spouse name: Not on file  . Number of children: Not on file  . Years of education: Not on file  . Highest education level: Not on file  Occupational History  . Not on file  Tobacco Use  . Smoking status: Never Smoker  . Smokeless tobacco: Never Used  Vaping Use  . Vaping Use: Never used  Substance and  Sexual Activity  . Alcohol use: Yes    Comment: SOCIAL  . Drug use: No  . Sexual activity: Not on file  Other Topics Concern  . Not on file  Social History Narrative  . Not on file   Social Determinants of Health   Financial Resource Strain: Not on file  Food Insecurity: Not on file  Transportation Needs: Not on file  Physical Activity: Not on file  Stress: Not on file  Social Connections: Not on file    Outpatient Encounter Medications as of 10/29/2020  Medication Sig  . cholecalciferol (VITAMIN D3) 25 MCG (1000 UT) tablet Take 1,000 Units by mouth daily.  . COLLAGEN PO Take 1 Scoop by mouth daily.  Marland Kitchen ELDERBERRY PO Take 1 capsule by mouth daily.  . ferrous sulfate 300 (60 Fe) MG/5ML syrup Take 300 mg by mouth daily with breakfast.   . levonorgestrel (MIRENA) 20 MCG/24HR IUD by Intrauterine route.  . Multiple Vitamins-Minerals (IMMUNE SUPPORT PO) Take 1 Scoop by mouth daily.  . vitamin B-12 (CYANOCOBALAMIN) 500 MCG tablet Take 500 mcg by mouth daily.  . mupirocin ointment (BACTROBAN) 2 % Apply 1 application topically 2 (two) times daily.  . [DISCONTINUED] docusate sodium (COLACE) 100 MG capsule Take 1 capsule (100 mg total) by mouth 2 (two) times daily. To keep stools soft (Patient not taking: Reported on 10/29/2020)  . [DISCONTINUED] INOSITOL PO  Take 1 tablet by mouth as needed. (Patient not taking: Reported on 10/29/2020)   No facility-administered encounter medications on file as of 10/29/2020.    Review of Systems  Constitutional: Negative for appetite change, fever and unexpected weight change.  HENT: Negative for congestion and sinus pressure.   Respiratory: Negative for cough, chest tightness and shortness of breath.   Cardiovascular: Negative for chest pain, palpitations and leg swelling.  Gastrointestinal: Negative for abdominal pain, diarrhea, nausea and vomiting.  Genitourinary: Negative for difficulty urinating and dysuria.  Musculoskeletal: Negative for joint  swelling and myalgias.  Skin: Negative for color change and rash.  Neurological: Negative for dizziness, light-headedness and headaches.  Psychiatric/Behavioral: Negative for agitation and dysphoric mood.       Objective:    Physical Exam Vitals reviewed.  Constitutional:      General: She is not in acute distress. HENT:     Head: Normocephalic and atraumatic.     Right Ear: External ear normal.     Left Ear: External ear normal.     Mouth/Throat:     Mouth: Oropharynx is clear and moist.  Eyes:     General: No scleral icterus.       Right eye: No discharge.        Left eye: No discharge.     Conjunctiva/sclera: Conjunctivae normal.  Neck:     Thyroid: No thyromegaly.  Cardiovascular:     Rate and Rhythm: Normal rate and regular rhythm.  Pulmonary:     Effort: No respiratory distress.     Breath sounds: Normal breath sounds. No wheezing.  Abdominal:     General: Bowel sounds are normal.     Palpations: Abdomen is soft.     Tenderness: There is no abdominal tenderness.  Musculoskeletal:        General: No swelling, tenderness or edema.     Cervical back: Neck supple. No tenderness.  Lymphadenopathy:     Cervical: No cervical adenopathy.  Skin:    Findings: No erythema or rash.     Comments: Raised - lesion - neck.  Non tender.    Neurological:     Mental Status: She is alert.  Psychiatric:        Mood and Affect: Mood normal.        Behavior: Behavior normal.     BP 112/64 (BP Location: Left Arm, Patient Position: Sitting)   Pulse 73   Temp 98.2 F (36.8 C)   Ht 5' 0.98" (1.549 m)   Wt 107 lb 9.6 oz (48.8 kg)   SpO2 97%   BMI 20.34 kg/m  Wt Readings from Last 3 Encounters:  10/29/20 107 lb 9.6 oz (48.8 kg)  12/16/19 110 lb (49.9 kg)  10/17/19 110 lb (49.9 kg)     Lab Results  Component Value Date   WBC 4.5 01/09/2020   HGB 14.4 01/09/2020   HCT 43.0 01/09/2020   PLT 260.0 01/09/2020   GLUCOSE 87 01/09/2020   CHOL 238 (H) 01/09/2020   TRIG 81.0  01/09/2020   HDL 98.10 01/09/2020   LDLCALC 123 (H) 01/09/2020   ALT 9 01/09/2020   AST 12 01/09/2020   NA 135 01/09/2020   K 4.0 01/09/2020   CL 100 01/09/2020   CREATININE 0.67 01/09/2020   BUN 16 01/09/2020   CO2 28 01/09/2020   TSH 3.39 01/09/2020    MM DIAG BREAST TOMO BILATERAL  Result Date: 02/17/2020 CLINICAL DATA:  Short-term follow-up for right breast calcifications, initially  assessed in January 2019. EXAM: DIGITAL DIAGNOSTIC BILATERAL MAMMOGRAM WITH CAD AND TOMO COMPARISON:  Previous exam(s). ACR Breast Density Category d: The breast tissue is extremely dense, which lowers the sensitivity of mammography. FINDINGS: Small group of round calcifications in the upper outer right breast are stable. There are no masses or areas of architectural distortion. There are no new or suspicious calcifications. Mammographic images were processed with CAD. IMPRESSION: 1. No evidence of breast malignancy. 2. Benign right breast calcifications, stable for over 2 years. RECOMMENDATION: Screening mammogram in one year.(Code:SM-B-01Y) I have discussed the findings and recommendations with the patient. If applicable, a reminder letter will be sent to the patient regarding the next appointment. BI-RADS CATEGORY  2: Benign. Electronically Signed   By: Lajean Manes M.D.   On: 02/17/2020 15:14       Assessment & Plan:   Problem List Items Addressed This Visit    Neck nodule    Warm compresses.  Bactroban.  Call with update.  Notify me if incomplete resolution.       History of anemia    Follow cbc.        History of abnormal cervical Pap smear    Followed by gyn. Up to date.            Einar Pheasant, MD

## 2020-11-04 ENCOUNTER — Encounter: Payer: Self-pay | Admitting: Internal Medicine

## 2020-11-04 DIAGNOSIS — R221 Localized swelling, mass and lump, neck: Secondary | ICD-10-CM | POA: Insufficient documentation

## 2020-11-04 NOTE — Assessment & Plan Note (Signed)
Warm compresses.  Bactroban.  Call with update.  Notify me if incomplete resolution.

## 2020-11-04 NOTE — Assessment & Plan Note (Signed)
Follow cbc.  

## 2020-11-04 NOTE — Assessment & Plan Note (Signed)
Followed by gyn.  Up to date.   ?

## 2020-11-21 ENCOUNTER — Encounter: Payer: Self-pay | Admitting: Internal Medicine

## 2020-12-06 ENCOUNTER — Encounter: Payer: Self-pay | Admitting: Internal Medicine

## 2020-12-17 ENCOUNTER — Encounter: Payer: Self-pay | Admitting: Internal Medicine

## 2020-12-17 ENCOUNTER — Other Ambulatory Visit: Payer: Self-pay

## 2020-12-17 ENCOUNTER — Ambulatory Visit (INDEPENDENT_AMBULATORY_CARE_PROVIDER_SITE_OTHER): Payer: 59 | Admitting: Internal Medicine

## 2020-12-17 VITALS — BP 108/68 | HR 80 | Temp 98.2°F | Resp 16 | Ht 61.0 in | Wt 112.0 lb

## 2020-12-17 DIAGNOSIS — Z0000 Encounter for general adult medical examination without abnormal findings: Secondary | ICD-10-CM

## 2020-12-17 DIAGNOSIS — Z8742 Personal history of other diseases of the female genital tract: Secondary | ICD-10-CM

## 2020-12-17 DIAGNOSIS — R221 Localized swelling, mass and lump, neck: Secondary | ICD-10-CM | POA: Diagnosis not present

## 2020-12-17 DIAGNOSIS — Z862 Personal history of diseases of the blood and blood-forming organs and certain disorders involving the immune mechanism: Secondary | ICD-10-CM | POA: Diagnosis not present

## 2020-12-17 DIAGNOSIS — E559 Vitamin D deficiency, unspecified: Secondary | ICD-10-CM | POA: Diagnosis not present

## 2020-12-17 NOTE — Progress Notes (Signed)
Patient ID: Alicia Huang, female   DOB: 12/24/1975, 45 y.o.   MRN: [id number]   Subjective:    Patient ID: Alicia Huang, female    DOB: 1976-08-10, 45 y.o.   MRN: [id number]  HPI This visit occurred during the SARS-CoV-2 public health emergency.  Safety protocols were in place, including screening questions prior to the visit, additional usage of staff PPE, and extensive cleaning of exam room while observing appropriate contact time as indicated for disinfecting solutions.  Patient here for her physical exam.  She has been having problems numbness in her arms and hands.  Occurs when sleeping.  Had massage - massage therapist.  Arm is better.  Still with hand numbness.  Discussed possible etiologies.  Discussed carpal tunnel.  Uses her arms and hands a lot.  Stays active.  No chest pain or sob reported.  No abdominal pain or bowel change reported.    Past Medical History:  Diagnosis Date  . Anemia   . History of abnormal cervical Pap smear    Past Surgical History:  Procedure Laterality Date  . colpolscopy    . LAPAROSCOPIC OVARIAN CYSTECTOMY Right 10/17/2019   Procedure: LAPAROSCOPIC OVARIAN CYSTECTOMY;  Surgeon: Benjaman Kindler, MD;  Location: [facility];  Service: Gynecology;  Laterality: Right;  . LAPAROSCOPY N/A 10/17/2019   Procedure: LAPAROSCOPY OPERATIVE;  Surgeon: Benjaman Kindler, MD;  Location: [facility];  Service: Gynecology;  Laterality: N/A;  . NO PAST SURGERIES     Family History  Problem Relation Age of Onset  . Hyperlipidemia Mother   . Hyperlipidemia Father   . Hypertension Father   . Cancer Father   . Breast cancer Neg Hx    Social History   Socioeconomic History  . Marital status: Unknown    Spouse name: Not on file  . Number of children: Not on file  . Years of education: Not on file  . Highest education level: Not on file  Occupational History  . Not on file  Tobacco Use  . Smoking status: Never Smoker  . Smokeless tobacco: Never Used  Vaping Use   . Vaping Use: Never used  Substance and Sexual Activity  . Alcohol use: Yes    Comment: SOCIAL  . Drug use: No  . Sexual activity: Not on file  Other Topics Concern  . Not on file  Social History Narrative  . Not on file   Social Determinants of Health   Financial Resource Strain: Not on file  Food Insecurity: Not on file  Transportation Needs: Not on file  Physical Activity: Not on file  Stress: Not on file  Social Connections: Not on file    Outpatient Encounter Medications as of 12/17/2020  Medication Sig  . cholecalciferol (VITAMIN D3) 25 MCG (1000 UT) tablet Take 1,000 Units by mouth daily.  . COLLAGEN PO Take 1 Scoop by mouth daily.  Marland Kitchen ELDERBERRY PO Take 1 capsule by mouth daily.  . ferrous sulfate 300 (60 Fe) MG/5ML syrup Take 300 mg by mouth daily with breakfast.   . levonorgestrel (MIRENA) 20 MCG/24HR IUD by Intrauterine route.  . Multiple Vitamins-Minerals (IMMUNE SUPPORT PO) Take 1 Scoop by mouth daily.  . vitamin B-12 (CYANOCOBALAMIN) 500 MCG tablet Take 500 mcg by mouth daily.  . [DISCONTINUED] mupirocin ointment (BACTROBAN) 2 % Apply 1 application topically 2 (two) times daily.   No facility-administered encounter medications on file as of 12/17/2020.    Review of Systems  Constitutional: Negative for appetite change and unexpected weight change.  HENT: Negative for congestion, sinus pressure and sore throat.   Eyes: Negative for pain and visual disturbance.  Respiratory: Negative for cough, chest tightness and shortness of breath.   Cardiovascular: Negative for chest pain, palpitations and leg swelling.  Gastrointestinal: Negative for abdominal pain, diarrhea, nausea and vomiting.  Genitourinary: Negative for difficulty urinating and dysuria.  Musculoskeletal: Negative for joint swelling and myalgias.  Skin: Negative for color change and rash.  Neurological: Negative for dizziness, light-headedness and headaches.       Numbness as outlined.     Hematological: Negative for adenopathy. Does not bruise/bleed easily.  Psychiatric/Behavioral: Negative for agitation and dysphoric mood.       Objective:    Physical Exam Vitals reviewed.  Constitutional:      General: She is not in acute distress.    Appearance: Normal appearance. She is well-developed and well-nourished.  HENT:     Head: Normocephalic and atraumatic.     Right Ear: External ear normal.     Left Ear: External ear normal.     Mouth/Throat:     Mouth: Oropharynx is clear and moist.  Eyes:     General: No scleral icterus.       Right eye: No discharge.        Left eye: No discharge.     Conjunctiva/sclera: Conjunctivae normal.  Neck:     Thyroid: No thyromegaly.  Cardiovascular:     Rate and Rhythm: Normal rate and regular rhythm.  Pulmonary:     Effort: No tachypnea, accessory muscle usage or respiratory distress.     Breath sounds: Normal breath sounds. No decreased breath sounds or wheezing.  Chest:  Breasts:     Right: No inverted nipple, mass, nipple discharge or tenderness (no axillary adenopathy).     Left: No inverted nipple, mass, nipple discharge or tenderness (no axilarry adenopathy).    Abdominal:     General: Bowel sounds are normal.     Palpations: Abdomen is soft.     Tenderness: There is no abdominal tenderness.  Musculoskeletal:        General: No swelling, tenderness or edema.     Cervical back: Neck supple. No tenderness.  Lymphadenopathy:     Cervical: No cervical adenopathy.  Skin:    Findings: No erythema or rash.  Neurological:     Mental Status: She is alert and oriented to person, place, and time.  Psychiatric:        Mood and Affect: Mood and affect and mood normal.        Behavior: Behavior normal.     BP 108/68   Pulse 80   Temp 98.2 F (36.8 C) (Oral)   Resp 16   Ht 5\' 1"  (1.549 m)   Wt 112 lb (50.8 kg)   SpO2 99%   BMI 21.16 kg/m  Wt Readings from Last 3 Encounters:  12/17/20 112 lb (50.8 kg)  10/29/20  107 lb 9.6 oz (48.8 kg)  12/16/19 110 lb (49.9 kg)     Lab Results  Component Value Date   WBC 3.5 (L) 12/21/2020   HGB 13.4 12/21/2020   HCT 40.6 12/21/2020   PLT 254.0 12/21/2020   GLUCOSE 83 12/21/2020   CHOL 193 12/21/2020   TRIG 50.0 12/21/2020   HDL 91.10 12/21/2020   LDLCALC 92 12/21/2020   ALT 10 12/21/2020   AST 14 12/21/2020   NA 141 12/21/2020   K 4.0 12/21/2020   CL 105 12/21/2020   CREATININE 0.59  12/21/2020   BUN 13 12/21/2020   CO2 32 12/21/2020   TSH 3.40 12/21/2020    MM DIAG BREAST TOMO BILATERAL  Result Date: 02/17/2020 CLINICAL DATA:  Short-term follow-up for right breast calcifications, initially assessed in January 2019. EXAM: DIGITAL DIAGNOSTIC BILATERAL MAMMOGRAM WITH CAD AND TOMO COMPARISON:  Previous exam(s). ACR Breast Density Category d: The breast tissue is extremely dense, which lowers the sensitivity of mammography. FINDINGS: Small group of round calcifications in the upper outer right breast are stable. There are no masses or areas of architectural distortion. There are no new or suspicious calcifications. Mammographic images were processed with CAD. IMPRESSION: 1. No evidence of breast malignancy. 2. Benign right breast calcifications, stable for over 2 years. RECOMMENDATION: Screening mammogram in one year.(Code:SM-B-01Y) I have discussed the findings and recommendations with the patient. If applicable, a reminder letter will be sent to the patient regarding the next appointment. BI-RADS CATEGORY  2: Benign. Electronically Signed   By: Lajean Manes M.D.   On: 02/17/2020 15:14       Assessment & Plan:   Problem List Items Addressed This Visit    Healthcare maintenance    Physical today 12/17/20.  Sees gyn for pelvic and pap smears.  Mammogram 02/17/20 - birads II.       History of abnormal cervical Pap smear    Has been followed by gyn.       History of anemia    Check cbc and iron studies.        Neck nodule    Neck nodule resolved.        Vitamin D deficiency    History of vitamin D deficiency.  Recheck with next labs.         Other Visit Diagnoses    Routine general medical examination at a health care facility    -  Primary       Einar Pheasant, MD

## 2020-12-20 ENCOUNTER — Telehealth: Payer: Self-pay | Admitting: *Deleted

## 2020-12-20 DIAGNOSIS — Z862 Personal history of diseases of the blood and blood-forming organs and certain disorders involving the immune mechanism: Secondary | ICD-10-CM

## 2020-12-20 DIAGNOSIS — Z13220 Encounter for screening for lipoid disorders: Secondary | ICD-10-CM

## 2020-12-20 DIAGNOSIS — E559 Vitamin D deficiency, unspecified: Secondary | ICD-10-CM

## 2020-12-20 NOTE — Telephone Encounter (Signed)
Order placed for labs.

## 2020-12-20 NOTE — Telephone Encounter (Signed)
Please place future orders for lab appt.  

## 2020-12-21 ENCOUNTER — Other Ambulatory Visit: Payer: Self-pay

## 2020-12-21 ENCOUNTER — Other Ambulatory Visit (INDEPENDENT_AMBULATORY_CARE_PROVIDER_SITE_OTHER): Payer: 59

## 2020-12-21 DIAGNOSIS — E559 Vitamin D deficiency, unspecified: Secondary | ICD-10-CM

## 2020-12-21 DIAGNOSIS — Z862 Personal history of diseases of the blood and blood-forming organs and certain disorders involving the immune mechanism: Secondary | ICD-10-CM

## 2020-12-21 DIAGNOSIS — Z13220 Encounter for screening for lipoid disorders: Secondary | ICD-10-CM

## 2020-12-21 LAB — COMPREHENSIVE METABOLIC PANEL
ALT: 10 U/L (ref 0–35)
AST: 14 U/L (ref 0–37)
Albumin: 4.1 g/dL (ref 3.5–5.2)
Alkaline Phosphatase: 58 U/L (ref 39–117)
BUN: 13 mg/dL (ref 6–23)
CO2: 32 mEq/L (ref 19–32)
Calcium: 9.6 mg/dL (ref 8.4–10.5)
Chloride: 105 mEq/L (ref 96–112)
Creatinine, Ser: 0.59 mg/dL (ref 0.40–1.20)
GFR: 110.23 mL/min (ref >60.00)
Glucose, Bld: 83 mg/dL (ref 70–99)
Potassium: 4 mEq/L (ref 3.5–5.1)
Sodium: 141 mEq/L (ref 135–145)
Total Bilirubin: 0.7 mg/dL (ref 0.2–1.2)
Total Protein: 7 g/dL (ref 6.0–8.3)

## 2020-12-21 LAB — CBC WITH DIFFERENTIAL/PLATELET
Basophils Absolute: 0 10*3/uL (ref 0.0–0.1)
Basophils Relative: 0.9 % (ref 0.0–3.0)
Eosinophils Absolute: 0.5 10*3/uL (ref 0.0–0.7)
Eosinophils Relative: 14.4 % — ABNORMAL HIGH (ref 0.0–5.0)
HCT: 40.6 % (ref 36.0–46.0)
Hemoglobin: 13.4 g/dL (ref 12.0–15.0)
Lymphocytes Relative: 43.5 % (ref 12.0–46.0)
Lymphs Abs: 1.5 10*3/uL (ref 0.7–4.0)
MCHC: 33 g/dL (ref 30.0–36.0)
MCV: 94.3 fl (ref 78.0–100.0)
Monocytes Absolute: 0.3 10*3/uL (ref 0.1–1.0)
Monocytes Relative: 8.9 % (ref 3.0–12.0)
Neutro Abs: 1.1 10*3/uL — ABNORMAL LOW (ref 1.4–7.7)
Neutrophils Relative %: 32.3 % — ABNORMAL LOW (ref 43.0–77.0)
Platelets: 254 10*3/uL (ref 150.0–400.0)
RBC: 4.3 Mil/uL (ref 3.87–5.11)
RDW: 12.2 % (ref 11.5–15.5)
WBC: 3.5 10*3/uL — ABNORMAL LOW (ref 4.0–10.5)

## 2020-12-21 LAB — TSH: TSH: 3.4 u[IU]/mL (ref 0.35–4.50)

## 2020-12-21 LAB — IBC + FERRITIN
Ferritin: 42.6 ng/mL (ref 10.0–291.0)
Iron: 69 ug/dL (ref 42–145)
Saturation Ratios: 21.4 % (ref 20.0–50.0)
Transferrin: 230 mg/dL (ref 212.0–360.0)

## 2020-12-21 LAB — LIPID PANEL
Cholesterol: 193 mg/dL (ref 0–200)
HDL: 91.1 mg/dL (ref >39.00)
LDL Cholesterol: 92 mg/dL (ref 0–99)
NonHDL: 101.9
Total CHOL/HDL Ratio: 2
Triglycerides: 50 mg/dL (ref 0.0–149.0)
VLDL: 10 mg/dL (ref 0.0–40.0)

## 2020-12-21 LAB — VITAMIN D 25 HYDROXY (VIT D DEFICIENCY, FRACTURES): VITD: 76.46 ng/mL (ref 30.00–100.00)

## 2020-12-22 ENCOUNTER — Other Ambulatory Visit: Payer: Self-pay | Admitting: Internal Medicine

## 2020-12-22 ENCOUNTER — Encounter: Payer: Self-pay | Admitting: Internal Medicine

## 2020-12-22 DIAGNOSIS — D72819 Decreased white blood cell count, unspecified: Secondary | ICD-10-CM

## 2020-12-22 NOTE — Assessment & Plan Note (Signed)
Check cbc and iron studies.   

## 2020-12-22 NOTE — Progress Notes (Signed)
Order placed for f/u cbc.   

## 2020-12-22 NOTE — Assessment & Plan Note (Signed)
Has been followed by gyn.   

## 2020-12-22 NOTE — Assessment & Plan Note (Signed)
Neck nodule resolved.

## 2020-12-22 NOTE — Assessment & Plan Note (Signed)
History of vitamin D deficiency.  Recheck with next labs.   ?

## 2020-12-22 NOTE — Assessment & Plan Note (Signed)
Physical today 12/17/20.  Sees gyn for pelvic and pap smears.  Mammogram 02/17/20 - birads II.

## 2021-01-04 ENCOUNTER — Other Ambulatory Visit: Payer: Self-pay | Admitting: Internal Medicine

## 2021-01-04 DIAGNOSIS — Z1231 Encounter for screening mammogram for malignant neoplasm of breast: Secondary | ICD-10-CM

## 2021-03-27 ENCOUNTER — Other Ambulatory Visit (INDEPENDENT_AMBULATORY_CARE_PROVIDER_SITE_OTHER): Payer: 59

## 2021-03-27 ENCOUNTER — Other Ambulatory Visit: Payer: Self-pay

## 2021-03-27 ENCOUNTER — Ambulatory Visit
Admission: RE | Admit: 2021-03-27 | Discharge: 2021-03-27 | Disposition: A | Discharge status: Home / Self Care | Payer: 59 | Source: Ambulatory Visit | Attending: Internal Medicine | Admitting: Internal Medicine

## 2021-03-27 DIAGNOSIS — Z1231 Encounter for screening mammogram for malignant neoplasm of breast: Secondary | ICD-10-CM | POA: Diagnosis not present

## 2021-03-27 DIAGNOSIS — D72819 Decreased white blood cell count, unspecified: Secondary | ICD-10-CM | POA: Diagnosis not present

## 2021-03-27 LAB — CBC WITH DIFFERENTIAL/PLATELET
Basophils Absolute: 0 10*3/uL (ref 0.0–0.1)
Basophils Relative: 0.9 % (ref 0.0–3.0)
Eosinophils Absolute: 0.4 10*3/uL (ref 0.0–0.7)
Eosinophils Relative: 8.3 % — ABNORMAL HIGH (ref 0.0–5.0)
HCT: 38.5 % (ref 36.0–46.0)
Hemoglobin: 12.8 g/dL (ref 12.0–15.0)
Lymphocytes Relative: 46 % (ref 12.0–46.0)
Lymphs Abs: 2.2 10*3/uL (ref 0.7–4.0)
MCHC: 33.2 g/dL (ref 30.0–36.0)
MCV: 92.8 fl (ref 78.0–100.0)
Monocytes Absolute: 0.4 10*3/uL (ref 0.1–1.0)
Monocytes Relative: 8.4 % (ref 3.0–12.0)
Neutro Abs: 1.7 10*3/uL (ref 1.4–7.7)
Neutrophils Relative %: 36.4 % — ABNORMAL LOW (ref 43.0–77.0)
Platelets: 233 10*3/uL (ref 150.0–400.0)
RBC: 4.15 Mil/uL (ref 3.87–5.11)
RDW: 12.5 % (ref 11.5–15.5)
WBC: 4.8 10*3/uL (ref 4.0–10.5)

## 2021-03-28 ENCOUNTER — Encounter: Payer: Self-pay | Admitting: Internal Medicine

## 2021-04-26 ENCOUNTER — Encounter: Payer: Self-pay | Admitting: Internal Medicine

## 2021-04-29 ENCOUNTER — Telehealth: Payer: Self-pay | Admitting: Internal Medicine

## 2021-04-29 ENCOUNTER — Telehealth: Payer: Self-pay

## 2021-04-29 DIAGNOSIS — M25531 Pain in right wrist: Secondary | ICD-10-CM

## 2021-04-29 DIAGNOSIS — M25532 Pain in left wrist: Secondary | ICD-10-CM

## 2021-04-29 NOTE — Telephone Encounter (Signed)
Pt is aware you are out of office this week.

## 2021-04-29 NOTE — Telephone Encounter (Signed)
Pt called and states that she wants a referral for OT [facility]  [facility] [address] in [place] with PT named Gwenette Greet for bilateral wrist pain. Their fax number is [phone]. Pt denied appt with PCP and states that PCP is aware of her wrist pain.

## 2021-04-29 NOTE — Telephone Encounter (Signed)
Called patient. She is on her 5th day of symptoms as of today and stated that she is actually feeling a lot better. She denies any fever, chills, body aches, sob, chest tightness, etc. She is going to continue to monitor and will be evaluated if needed.

## 2021-04-29 NOTE — Telephone Encounter (Signed)
Order for referral to OT placed.  I sent her a my chart message.

## 2021-04-29 NOTE — Addendum Note (Signed)
Addended by: Alisa Graff on: 04/29/2021 08:43 PM   Modules accepted: Orders

## 2021-05-09 ENCOUNTER — Ambulatory Visit: Payer: 59 | Admitting: Occupational Therapy

## 2021-05-22 ENCOUNTER — Encounter: Payer: 59 | Admitting: Occupational Therapy

## 2021-05-22 ENCOUNTER — Ambulatory Visit: Payer: 59 | Attending: Internal Medicine | Admitting: Occupational Therapy

## 2021-05-22 ENCOUNTER — Encounter: Payer: Self-pay | Admitting: Occupational Therapy

## 2021-05-22 DIAGNOSIS — M25532 Pain in left wrist: Secondary | ICD-10-CM | POA: Diagnosis not present

## 2021-05-22 DIAGNOSIS — M25631 Stiffness of right wrist, not elsewhere classified: Secondary | ICD-10-CM | POA: Diagnosis not present

## 2021-05-22 DIAGNOSIS — M654 Radial styloid tenosynovitis [de Quervain]: Secondary | ICD-10-CM | POA: Diagnosis not present

## 2021-05-22 DIAGNOSIS — M25531 Pain in right wrist: Secondary | ICD-10-CM | POA: Insufficient documentation

## 2021-05-22 NOTE — Therapy (Signed)
[facility] [address] [place], [address] Phone: [phone]   Fax:  [phone]  Occupational Therapy Evaluation  Patient Details  Name: Alicia Huang MRN: [id number] Date of Birth: 1976-10-24 Referring Provider (OT): DR Einar Pheasant   Encounter Date: 05/22/2021   OT End of Session - 05/22/21 1847     Visit Number 1    Number of Visits 10    Date for OT Re-Evaluation 07/03/21    OT Start Time 6962    OT Stop Time 1530    OT Time Calculation (min) 45 min    Activity Tolerance Patient tolerated treatment well    Behavior During Therapy [facility] for tasks assessed/performed             Past Medical History:  Diagnosis Date   Anemia    History of abnormal cervical Pap smear     Past Surgical History:  Procedure Laterality Date   colpolscopy     LAPAROSCOPIC OVARIAN CYSTECTOMY Right 10/17/2019   Procedure: LAPAROSCOPIC OVARIAN CYSTECTOMY;  Surgeon: Benjaman Kindler, MD;  Location: [facility];  Service: Gynecology;  Laterality: Right;   LAPAROSCOPY N/A 10/17/2019   Procedure: LAPAROSCOPY OPERATIVE;  Surgeon: Benjaman Kindler, MD;  Location: [facility];  Service: Gynecology;  Laterality: N/A;   NO PAST SURGERIES      There were no vitals filed for this visit.   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 Yes    Pain Orientation Right;Left    Pain Descriptors / Indicators Aching;Tightness;Tender    Pain Type Acute pain    Pain Onset More than a month ago    Aggravating Factors  with thumb RA, wrist flexion, ext, RD, UD               [facility] OT Assessment - 05/22/21 0001       Assessment   Medical Diagnosis Bilateral DeQuervain    Referring Provider (OT) DR Einar Pheasant    Onset Date/Surgical Date 10/24/20    Hand Dominance Right      Prior Function   Vocation Full time employment    Leisure Work as pelvic health PT , beekeeping, hike, yoga, cook , break dance      AROM   Right Wrist Extension 50 Degrees    Right Wrist Flexion 90 Degrees    Right Wrist Radial Deviation 22 Degrees    Right Wrist Ulnar Deviation 22 Degrees    Left Wrist Extension 80 Degrees    Left  Wrist Flexion 90 Degrees    Left Wrist Radial Deviation 24 Degrees    Left Wrist Ulnar Deviation 35 Degrees      Right Hand AROM   R Thumb Radial ABduction/ADduction 0-55 50   pain   R Thumb Opposition to Index --   opposition WNL     Left Hand AROM   L Thumb Radial ADduction/ABduction 0-55 50   pain 2/10   L Thumb Opposition to Index --   WNL - no pain            Pt to do contrast 2-3 x day   Thumb spica fabricated for R one and L soft neoprene wrist and thumb wrap  Wear most all the time  AROM pain free for thumb and wrist  Do some ice massage during day over 1st dorsal compartment  And modify activities                 OT Education - 05/22/21 1847     Education Details findings of eval and HEP - splint wearing and ionto use    Methods Explanation;Demonstration;Tactile cues;Verbal cues;Handout    Comprehension Verbal cues required;Returned demonstration;Verbalized understanding                 OT  Long Term Goals - 05/22/21 1852       OT LONG TERM GOAL #1   Title Pain in R and L wrist /thumb decrease to lessthan 2/10 with AROM in all planes to wean out of splints    Baseline pain 8/10 in R and L 4/10 - tenderness bilateral distal radius 8/10 - thumb spica on R hand/wrist 100% of time , neoprene wrist and thumb wrap on L    Time 5    Period Weeks    Status New    Target Date 06/26/21      OT LONG TERM GOAL #2   Title Pt to be independent in HEP in wearing of splints, do contrast and modifications to decrease pain less than 3/10    Baseline pain 8/10 on bilateral with AROM -and tenderness R 8/10 ; L 4/10    Time 3    Status New    Target Date 06/12/21      OT LONG TERM GOAL #3   Title Pt to be able to wean out of splint and return to using bilateral wrist and hands with modifications with symptoms less than 2/10    Baseline thumb spica on R amd L neoprene wrist and thumb wrap -and pain 8/10 with AROM thumb and wrist    Time 6    Period Weeks    Status New    Target Date 07/03/21                   Plan - 05/22/21 1848     Clinical Impression Statement Pt present at OT eval with bilateral wrist pain - R worse than L - started about 6 months ago with numbness in bilateral hands at night time -she did some manual on forearm and it got better - but then started having some thumb and wrist pain - 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 Pt show pain in wrist in all planes and decrease R wrist AROM compare to L. Pt can benefit from skilled  ot services for decrease pain , increase AROM and strength in bilateral hands to be able return to prior level of function - splinting , ionto , modifications to tasks , modalities and ther ex    Occupational performance deficits (Please refer to evaluation for details): ADL's;IADL's;Rest  and Sleep;Leisure;Social Participation;Play;Work    Marketing executive / Function / Physical Skills ADL;Coordination;Flexibility;IADL;ROM;UE functional use;Pain;Dexterity    Clinical Decision Making Limited treatment options, no task modification necessary    Comorbidities Affecting Occupational Performance: None    Modification or Assistance to Complete Evaluation  No modification of tasks or assist necessary to complete eval    OT Frequency 2x / week    OT Duration 6 weeks    OT Treatment/Interventions Self-care/ADL training;Cryotherapy;Fluidtherapy;Contrast Bath;Therapeutic exercise;Manual Therapy;Splinting;DME and/or AE instruction    Consulted and Agree with Plan of Care Patient             Patient will benefit from skilled therapeutic intervention in order to improve the following deficits and impairments:   Body Structure / Function / Physical Skills: ADL, Coordination, Flexibility, IADL, ROM, UE functional use, Pain, Dexterity       Visit Diagnosis: Radial styloid tenosynovitis (de quervain) - Plan: Ot plan of care cert/re-cert  Pain in left wrist - Plan: Ot plan of care cert/re-cert  Pain in right wrist - Plan: Ot plan of care cert/re-cert  Stiffness of right wrist, not elsewhere classified - Plan: Ot plan of care cert/re-cert    Problem List Patient Active Problem List   Diagnosis Date Noted   Neck nodule 11/04/2020   Teratoma of ovary, 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, [place], [address] Phone: [phone]   Fax:  [phone]  Name: Sophina Mitten MRN: [id number] Date of Birth: 03-10-1976

## 2021-05-23 ENCOUNTER — Telehealth: Payer: Self-pay | Admitting: Internal Medicine

## 2021-05-23 NOTE — Telephone Encounter (Signed)
My chart message sent to pt regarding therapy.

## 2021-05-29 ENCOUNTER — Ambulatory Visit: Payer: 59 | Admitting: Occupational Therapy

## 2021-05-31 ENCOUNTER — Ambulatory Visit: Payer: 59 | Attending: Internal Medicine | Admitting: Occupational Therapy

## 2021-05-31 DIAGNOSIS — M25532 Pain in left wrist: Secondary | ICD-10-CM | POA: Insufficient documentation

## 2021-05-31 DIAGNOSIS — M25631 Stiffness of right wrist, not elsewhere classified: Secondary | ICD-10-CM | POA: Diagnosis not present

## 2021-05-31 DIAGNOSIS — M654 Radial styloid tenosynovitis [de Quervain]: Secondary | ICD-10-CM | POA: Diagnosis not present

## 2021-05-31 DIAGNOSIS — M25531 Pain in right wrist: Secondary | ICD-10-CM | POA: Diagnosis not present

## 2021-05-31 NOTE — Therapy (Signed)
[facility] [address], [place], [address] Phone: [phone]   Fax:  [phone]  Occupational Therapy Treatment  Patient Details  Name: Alicia Huang MRN: [id number] Date of Birth: January 27, 1976 Referring Provider (OT): DR Einar Pheasant   Encounter Date: 05/31/2021   OT End of Session - 05/31/21 1221     Visit Number 2    Number of Visits 10    Date for OT Re-Evaluation 07/03/21    OT Start Time 0801    OT Stop Time 0901    OT Time Calculation (min) 60 min    Activity Tolerance Patient tolerated treatment well    Behavior During Therapy [facility] for tasks assessed/performed             Past Medical History:  Diagnosis Date   Anemia    History of abnormal cervical Pap smear     Past Surgical History:  Procedure Laterality Date   colpolscopy     LAPAROSCOPIC OVARIAN CYSTECTOMY Right 10/17/2019   Procedure: LAPAROSCOPIC OVARIAN CYSTECTOMY;  Surgeon: Benjaman Kindler, MD;  Location: [facility];  Service: Gynecology;  Laterality: Right;   LAPAROSCOPY N/A 10/17/2019   Procedure: LAPAROSCOPY OPERATIVE;  Surgeon: Benjaman Kindler, MD;  Location: [facility];  Service: Gynecology;  Laterality: N/A;   NO PAST SURGERIES      There were no vitals filed for this visit.   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 Yes    Pain Score 8     Pain Location Wrist    Pain Orientation Right;Left    Pain Descriptors / Indicators Tightness;Tender    Pain Frequency Intermittent                Pt cont to have same symptoms - did not follow thru with HEP as she should have  Doing some kinesiotaping at work - in stead of splints  And observe pt pulling  on pop socked - that was broken and text on her phone with bilateral thumbs  REinforce and review again modifications , splint wearing and joint protection with work, IADL's and ADL's         Skin check done prior and afterwards -  tolerated well - skin check done- had one tiny pin size blister on L    OT Treatments/Exercises (OP) - 05/31/21 0001       Iontophoresis   Type of Iontophoresis Dexamethasone    Location bilateral 1st dorsal compartment    Dose med patch , 2.0 current -    Time 19 min      RUE Contrast Bath   Time 8 minutes    Comments wrist  and thumb      LUE Contrast Bath   Time 8 minutes    Comments wrist and thumb             Pt to do contrast 2-3 x day Done this date some graston tool nr 2 sweeping for wrist and foreram - tight and trigger points on radial forearm - MC and Carpal spreads done - prior to some pain free AAROM of thumb and wrist    Thumb spica fabricated for R one and L soft neoprene wrist and thumb wrap fitted with eval  - encourage for pt to wear most all the time AROM pain free for thumb and wrist after contrast at home Do some ice massage during day over 1st dorsal compartment And modify activities        OT Education - 05/31/21 1221     Education Details splint weairng , ionto use , HEP and modifications    Methods Explanation;Demonstration;Tactile cues;Verbal cues;Handout    Comprehension Verbal cues required;Returned demonstration;Verbalized understanding                 OT Long Term Goals - 05/22/21 1852       OT LONG TERM GOAL #1    Title Pain in R and L wrist /thumb decrease to lessthan 2/10 with AROM in all planes to wean out of splints    Baseline pain 8/10 in R and L 4/10 - tenderness bilateral distal radius 8/10 - thumb spica on R hand/wrist 100% of time , neoprene wrist and thumb wrap on L    Time 5    Period Weeks    Status New    Target Date 06/26/21      OT LONG TERM GOAL #2   Title Pt to be independent in HEP in wearing of splints, do contrast and modifications to decrease pain less than 3/10    Baseline pain 8/10 on bilateral with AROM -and tenderness R 8/10 ; L 4/10    Time 3    Status New    Target Date 06/12/21      OT LONG TERM GOAL #3   Title Pt to be able to wean out of splint and return to using bilateral wrist and hands with modifications with symptoms less than 2/10    Baseline thumb spica on R amd L neoprene wrist and thumb wrap -and pain 8/10 with AROM thumb and wrist    Time 6    Period Weeks    Status New    Target Date 07/03/21                   Plan - 05/31/21 1221     Clinical Impression Statement Pt present at OT eval last week with bilateral wrist pain - R worse than L - started about 6 months ago with numbness in bilateral hands at night time -she did some manual on forearm and it got better - but then started having some thumb and wrist pain - 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 Pt show pain in wrist in all planes and decrease R wrist AROM compare to L Pt cont to have same symptoms - initiated this date ionto with dexamethazone - and review and reinforce again with pt modifications to work and ADL's/IADL's . Pt can  benefit from skilled ot services for decrease pain , increase AROM and strength in bilateral hands to be able return to prior level of function - splinting , ionto , modifications to tasks , modalities and  ther ex    OT Occupational Profile and History Problem Focused Assessment - Including review of records relating to presenting problem    Occupational performance deficits (Please refer to evaluation for details): ADL's;IADL's;Rest and Sleep;Leisure;Social Participation;Play;Work    Marketing executive / Function / Physical Skills ADL;Coordination;Flexibility;IADL;ROM;UE functional use;Pain;Dexterity    Rehab Potential Good    Clinical Decision Making Limited treatment options, no task modification necessary    Comorbidities Affecting Occupational Performance: None    Modification or Assistance to Complete Evaluation  No modification of tasks or assist necessary to complete eval    OT Frequency 2x / week    OT Duration 6 weeks    OT Treatment/Interventions Self-care/ADL training;Cryotherapy;Fluidtherapy;Contrast Bath;Therapeutic exercise;Manual Therapy;Splinting;DME and/or AE instruction    Consulted and Agree with Plan of Care Patient             Patient will benefit from skilled therapeutic intervention in order to improve the following deficits and impairments:   Body Structure / Function / Physical Skills: ADL, Coordination, Flexibility, IADL, ROM, UE functional use, Pain, Dexterity       Visit Diagnosis: Radial styloid tenosynovitis (de quervain)  Pain in left wrist  Pain in right wrist  Stiffness of right wrist, not elsewhere classified    Problem List Patient Active Problem List   Diagnosis Date Noted   Neck nodule 11/04/2020   Teratoma of ovary, 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 [place], [address] Phone:  [phone]   Fax:  [phone]  Name: Alicia Huang MRN: [id number] Date of Birth: 06/04/1976

## 2021-06-05 ENCOUNTER — Ambulatory Visit: Payer: 59 | Admitting: Occupational Therapy

## 2021-06-05 DIAGNOSIS — M654 Radial styloid tenosynovitis [de Quervain]: Secondary | ICD-10-CM

## 2021-06-05 DIAGNOSIS — M25531 Pain in right wrist: Secondary | ICD-10-CM | POA: Diagnosis not present

## 2021-06-05 DIAGNOSIS — M25532 Pain in left wrist: Secondary | ICD-10-CM

## 2021-06-05 DIAGNOSIS — M25631 Stiffness of right wrist, not elsewhere classified: Secondary | ICD-10-CM | POA: Diagnosis not present

## 2021-06-05 NOTE — Therapy (Signed)
[facility] [address], [place], [address] Phone: [phone]   Fax:  [phone]  Occupational Therapy Treatment  Patient Details  Name: Alicia Huang MRN: [id number] Date of Birth: 1976/06/25 Referring Provider (OT): DR Einar Pheasant   Encounter Date: 06/05/2021   OT End of Session - 06/05/21 1644     Visit Number 3    Number of Visits 10    Date for OT Re-Evaluation 07/03/21    OT Start Time 7824    OT Stop Time 1550    OT Time Calculation (min) 65 min    Activity Tolerance Patient tolerated treatment well    Behavior During Therapy [facility] for tasks assessed/performed             Past Medical History:  Diagnosis Date   Anemia    History of abnormal cervical Pap smear     Past Surgical History:  Procedure Laterality Date   colpolscopy     LAPAROSCOPIC OVARIAN CYSTECTOMY Right 10/17/2019   Procedure: LAPAROSCOPIC OVARIAN CYSTECTOMY;  Surgeon: Benjaman Kindler, MD;  Location: [facility];  Service: Gynecology;  Laterality: Right;   LAPAROSCOPY N/A 10/17/2019   Procedure: LAPAROSCOPY OPERATIVE;  Surgeon: Benjaman Kindler, MD;  Location: [facility];  Service: Gynecology;  Laterality: N/A;   NO PAST SURGERIES      There were no vitals filed for this visit.   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 Yes    Pain Score 8     Pain Location Wrist    Pain Orientation Right;Left    Pain Descriptors / Indicators Tightness;Tender;Shooting    Pain Type Acute pain    Pain Onset More than a month ago    Pain Frequency Intermittent                  Pt cont to have same symptoms - did not follow thru with HEP as she should have with splint and modifications  And splint melted some and do not fit  Had to modify wrist and thumb piece   Doing some kinesiotaping at work - in stead of splints  And observe pt pulling  on pop socked - that was broken and text on her phone with bilateral thumbs REinforce and review again modifications , splint wearing and joint protection with work, IADL's and ADL's                Skin check done prior -  tolerated well - skin check done-pt to keep patches on for about hour  t           OT Treatments/Exercises (OP) - 06/05/21 0001       Iontophoresis   Type of Iontophoresis Dexamethasone    Location bilateral 1st dorsal compartment    Dose med patch , 2.0 current -    Time 19 min      RUE Contrast Bath   Time 8 minutes    Comments wrist and thumb prior to soft tissue      LUE Contrast Bath   Time 8 minutes    Comments prior to soft tissue and ROM               done contrast and pt to cont  contrast 2-3 x day Done this date some graston tool nr 2 sweeping for wrist and foreram - tight and trigger points on radial forearm  and dorsal- MC and Carpal spreads done - prior to some pain free AAROM of thumb and Pt wrist    Thumb spica fabricated for R one and L soft neoprene wrist and thumb wrap fitted with eval  - encourage for pt to wear most all the time AROM pain free for thumb and wrist after contrast at home Do some ice massage during day over 1st dorsal compartment And modify activities           OT Education - 06/05/21 1644     Education Details splint weairng , ionto use , HEP and modifications    Person(s) Educated Patient    Methods Explanation;Demonstration;Tactile cues;Verbal cues;Handout    Comprehension Verbal cues required;Returned demonstration;Verbalized understanding                 OT Long Term Goals - 05/22/21 1852       OT LONG TERM GOAL #1   Title Pain in R and L wrist /thumb decrease to lessthan 2/10 with AROM in all planes to wean out of splints    Baseline pain 8/10 in R and L 4/10 - tenderness bilateral distal radius 8/10 - thumb spica on R hand/wrist 100% of time , neoprene wrist and thumb wrap on L    Time 5    Period Weeks    Status New    Target Date 06/26/21      OT LONG TERM GOAL #2   Title Pt to be independent in HEP in wearing of splints, do contrast and modifications to decrease pain less than 3/10    Baseline pain 8/10 on bilateral with AROM -and tenderness R 8/10 ; L 4/10    Time 3    Status New    Target Date 06/12/21      OT LONG TERM GOAL #3   Title Pt to be able to wean out of splint and return to using bilateral wrist and hands with modifications with symptoms less than 2/10    Baseline thumb spica on R amd L neoprene wrist and thumb wrap -and pain 8/10 with AROM thumb and wrist    Time 6    Period Weeks    Status New    Target Date 07/03/21                   Plan - 06/05/21 1645     Clinical Impression Statement Pt present at OT eval last week with bilateral wrist pain - R worse than L - started about 6 months ago with numbness in bilateral hands at night time -she did some manual on forearm and  it got better - but then started having some thumb and wrist pain - 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 Pt can benefit from skilled ot services for decrease pain , increase AROM and strength in bilateral hands to be able return to prior level of function - splinting , ionto , modifications to tasks , modalities and ther ex    OT Occupational Profile and History Problem Focused Assessment - Including review of records relating to presenting problem    Occupational performance deficits (Please refer to evaluation for details): ADL's;IADL's;Rest and Sleep;Leisure;Social Participation;Play;Work    Marketing executive / Function / Physical Skills ADL;Coordination;Flexibility;IADL;ROM;UE functional use;Pain;Dexterity    Rehab Potential Good    Clinical Decision Making Limited treatment options, no task modification necessary    Comorbidities Affecting Occupational Performance: None    Modification or Assistance to Complete Evaluation  No modification of tasks or assist necessary to complete eval    OT Frequency 2x / week    OT Duration 6 weeks    OT Treatment/Interventions Self-care/ADL training;Cryotherapy;Fluidtherapy;Contrast Bath;Therapeutic exercise;Manual Therapy;Splinting;DME and/or AE instruction    Consulted and Agree with Plan of Care Patient             Patient will benefit from skilled therapeutic intervention in order to improve the following deficits and impairments:   Body Structure / Function / Physical Skills: ADL, Coordination, Flexibility, IADL, ROM, UE functional use, Pain, Dexterity       Visit Diagnosis: Radial styloid tenosynovitis (de quervain)  Pain in left  wrist  Pain in right wrist  Stiffness of right wrist, not elsewhere classified    Problem List Patient Active Problem List   Diagnosis Date Noted   Neck nodule 11/04/2020   Teratoma of ovary, 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 [place], [address] Phone: [phone]   Fax:  [phone]  Name: Hailie Searight MRN: [id number] Date of Birth: Mar 08, 1976

## 2021-06-12 ENCOUNTER — Encounter: Payer: 59 | Admitting: Occupational Therapy

## 2021-06-12 ENCOUNTER — Ambulatory Visit: Payer: 59 | Admitting: Occupational Therapy

## 2021-06-12 DIAGNOSIS — M25531 Pain in right wrist: Secondary | ICD-10-CM

## 2021-06-12 DIAGNOSIS — M25631 Stiffness of right wrist, not elsewhere classified: Secondary | ICD-10-CM | POA: Diagnosis not present

## 2021-06-12 DIAGNOSIS — M654 Radial styloid tenosynovitis [de Quervain]: Secondary | ICD-10-CM

## 2021-06-12 DIAGNOSIS — M25532 Pain in left wrist: Secondary | ICD-10-CM

## 2021-06-12 NOTE — Therapy (Signed)
[facility] [address] [place], [address] Phone: [phone]   Fax:  [phone]  Occupational Therapy Treatment  Patient Details  Name: Alicia Huang MRN: [id number] Date of Birth: 04-29-1976 Referring Provider (OT): DR Einar Pheasant   Encounter Date: 06/12/2021   OT End of Session - 06/12/21 2031     Visit Number 4    Number of Visits 10    Date for OT Re-Evaluation 07/03/21    OT Start Time 5462    OT Stop Time 1713    OT Time Calculation (min) 58 min    Activity Tolerance Patient tolerated treatment well    Behavior During Therapy [facility] for tasks assessed/performed             Past Medical History:  Diagnosis Date   Anemia    History of abnormal cervical Pap smear     Past Surgical History:  Procedure Laterality Date   colpolscopy     LAPAROSCOPIC OVARIAN CYSTECTOMY Right 10/17/2019   Procedure: LAPAROSCOPIC OVARIAN CYSTECTOMY;  Surgeon: Benjaman Kindler, MD;  Location: [facility];  Service: Gynecology;  Laterality: Right;   LAPAROSCOPY N/A 10/17/2019   Procedure: LAPAROSCOPY OPERATIVE;  Surgeon: Benjaman Kindler, MD;  Location: [facility];  Service: Gynecology;  Laterality: N/A;   NO PAST SURGERIES      There were no vitals filed for this visit.   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 Yes    Pain Score 8    FInkelstein bil , And R 4, L 3 tenderness at distal  radius head   Pain Location Wrist    Pain Orientation Right;Left    Pain Descriptors / Indicators Tightness;Tender;Shooting    Pain Type Acute pain    Pain Onset More than a month ago    Pain Frequency Intermittent                 Skin check done prior -  tolerated well -pt to keep patches on for about hour                                        OT Treatments/Exercises (OP) - 06/12/21 0001       Iontophoresis   Type of Iontophoresis Dexamethasone    Location bilateral 1st dorsal compartment    Dose med patch , 2.0 current -    Time 19      RUE Contrast Bath   Time 8 minutes    Comments wrist and thumb      LUE Contrast Bath   Time 8 minutes    Comments wrist and thumb             done contrast and pt to  cont  contrast 2-3 x day Done this date some graston tool nr 2 sweeping for wrist and foreram - tight and trigger points on radial forearms and volar- MC and Carpal spreads done - prior to pain free AAROM of thumb and wrist in all planes    Thumb spica fabricated for R in past and needed some modifications at thumb and  L soft neoprene wrist and thumb wrap fitted with eval  bub add a hard thumb spica piece for L  - encourage for pt to wear most all the time AROM pain free for thumb and wrist after contrast at home Do some ice massage during day over 1st dorsal compartment And cont with modify activities                 OT Long Term Goals - 06/12/21 2036       OT LONG TERM GOAL #1   Title Pain in R and L wrist /thumb decrease to lessthan 2/10 with AROM in all planes to wean out of splints    Baseline pain 8/10 in R and L 4/10 - tenderness bilateral distal radius 8/10 - thumb spica on R hand/wrist 100% of time , neoprene wrist and thumb wrap on L    Time 5    Period Weeks    Status New    Target Date 06/26/21      OT LONG TERM GOAL #2   Title Pt to be independent in HEP in wearing of splints, do contrast and modifications to  decrease pain less than 3/10    Baseline pain 8/10 on bilateral with AROM -and tenderness R 8/10 ; L 4/10    Time 3    Period Weeks    Status New    Target Date 06/12/21      OT LONG TERM GOAL #3   Title Pt to be able to wean out of splint and return to using bilateral wrist and hands with modifications with symptoms less than 2/10    Baseline thumb spica on R amd L neoprene wrist and thumb wrap -and pain 8/10 with AROM thumb and wrist    Time 6    Period Weeks    Status New    Target Date 07/03/21                   Plan - 06/12/21 2032     Clinical Impression Statement Pt present at OT eval with bilateral wrist pain - R worse than L - started about 6 months ago with numbness in bilateral hands at night time -she did some manual on forearm and it got better - but then started having some thumb and wrist pain - 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  Pt show increase AROM in wrist flexion, ext, and thumb PA and RA with less pain -done  ionto with dexamethazone again.  Pt can benefit from skilled OT services for decrease pain , increase AROM and strength in bilateral hands to be able return to  prior level of function - splinting , ionto , modifications to tasks , modalities and ther ex    OT Occupational Profile and History Problem Focused Assessment - Including review of records relating to presenting problem    Occupational performance deficits (Please refer to evaluation for details): ADL's;IADL's;Rest and Sleep;Leisure;Social Participation;Play;Work    Marketing executive /  Function / Physical Skills ADL;Coordination;Flexibility;IADL;ROM;UE functional use;Pain;Dexterity    Rehab Potential Good    Clinical Decision Making Limited treatment options, no task modification necessary    Comorbidities Affecting Occupational Performance: None    Modification or Assistance to Complete Evaluation  No modification of tasks or assist necessary to complete eval    OT Frequency 2x / week    OT Duration 6 weeks    OT Treatment/Interventions Self-care/ADL training;Cryotherapy;Fluidtherapy;Contrast Bath;Therapeutic exercise;Manual Therapy;Splinting;DME and/or AE instruction    Consulted and Agree with Plan of Care Patient             Patient will benefit from skilled therapeutic intervention in order to improve the following deficits and impairments:   Body Structure / Function / Physical Skills: ADL, Coordination, Flexibility, IADL, ROM, UE functional use, Pain, Dexterity       Visit Diagnosis: Radial styloid tenosynovitis (de quervain)  Pain in left wrist  Pain in right wrist  Stiffness of right wrist, not elsewhere classified    Problem List Patient Active Problem List   Diagnosis Date Noted   Neck nodule 11/04/2020   Teratoma of ovary, 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 [place], [address] Phone: [phone]   Fax:  [phone]  Name: Alicia Huang MRN: [id number] Date of Birth: 02-22-1976

## 2021-06-13 ENCOUNTER — Encounter: Payer: 59 | Admitting: Occupational Therapy

## 2021-06-19 ENCOUNTER — Ambulatory Visit: Payer: 59 | Admitting: Occupational Therapy

## 2021-06-19 DIAGNOSIS — M25532 Pain in left wrist: Secondary | ICD-10-CM

## 2021-06-19 DIAGNOSIS — D2261 Melanocytic nevi of right upper limb, including shoulder: Secondary | ICD-10-CM | POA: Diagnosis not present

## 2021-06-19 DIAGNOSIS — M654 Radial styloid tenosynovitis [de Quervain]: Secondary | ICD-10-CM

## 2021-06-19 DIAGNOSIS — M25531 Pain in right wrist: Secondary | ICD-10-CM | POA: Diagnosis not present

## 2021-06-19 DIAGNOSIS — L578 Other skin changes due to chronic exposure to nonionizing radiation: Secondary | ICD-10-CM | POA: Diagnosis not present

## 2021-06-19 DIAGNOSIS — M25631 Stiffness of right wrist, not elsewhere classified: Secondary | ICD-10-CM

## 2021-06-19 DIAGNOSIS — D2262 Melanocytic nevi of left upper limb, including shoulder: Secondary | ICD-10-CM | POA: Diagnosis not present

## 2021-06-19 DIAGNOSIS — D225 Melanocytic nevi of trunk: Secondary | ICD-10-CM | POA: Diagnosis not present

## 2021-06-19 DIAGNOSIS — L814 Other melanin hyperpigmentation: Secondary | ICD-10-CM | POA: Diagnosis not present

## 2021-06-19 DIAGNOSIS — L821 Other seborrheic keratosis: Secondary | ICD-10-CM | POA: Diagnosis not present

## 2021-06-19 NOTE — Therapy (Signed)
[place] [address], [place], [address] Phone: [phone]   Fax:  [phone]  Occupational Therapy Treatment  Patient Details  Name: Alicia Huang MRN: [id number] Date of Birth: [birth year] Referring Provider (OT): DR Einar Pheasant   Encounter Date: 06/19/2021   OT End of Session - 06/19/21 1726     Visit Number 5    Number of Visits 10    Date for OT Re-Evaluation 07/03/21    OT Start Time [id number]    OT Stop Time 1714    OT Time Calculation (min) 59 min    Activity Tolerance Patient tolerated treatment well    Behavior During Therapy [facility] for tasks assessed/performed             Past Medical History:  Diagnosis Date   Anemia    History of abnormal cervical Pap smear     Past Surgical History:  Procedure Laterality Date   colpolscopy     LAPAROSCOPIC OVARIAN CYSTECTOMY Right 10/17/2019   Procedure: LAPAROSCOPIC OVARIAN CYSTECTOMY;  Surgeon: Benjaman Kindler, MD;  Location: [facility];  Service: Gynecology;  Laterality: Right;   LAPAROSCOPY N/A 10/17/2019   Procedure: LAPAROSCOPY OPERATIVE;  Surgeon: Benjaman Kindler, MD;  Location: [facility];  Service: Gynecology;  Laterality: N/A;   NO PAST SURGERIES      There were no vitals filed for this visit.   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 Yes    Pain Score 6     Pain Location Wrist    Pain Orientation Right;Left                [facility] OT Assessment - 06/19/21 0001       AROM   Right Wrist Extension 63 Degrees    Right Wrist Flexion 100 Degrees    Right Wrist Radial Deviation 25 Degrees    Right Wrist Ulnar Deviation 35 Degrees    Left Wrist Extension 85 Degrees    Left Wrist Flexion 95 Degrees    Left Wrist Radial Deviation 25 Degrees    Left Wrist Ulnar Deviation 40 Degrees      Right Hand AROM   R Thumb Radial ABduction/ADduction 0-55 40    R Thumb Opposition to Index --   opposition to base of 5th     Left Hand AROM   L Thumb Radial ADduction/ABduction 0-55 50    L Thumb Opposition to Index --   opposition to base of 5th             Assess AROM for bilateral wrist and thumb - decrease pain bilateral  And increase AROM for R wrist and thumb  with less pain  Pain mostly with finkelstein - UD and thumb flexion      Pt some small scabs on bilateral wrist where patches were- pt maybe kept it to long on - removed patches this date -pt has 5 days until next session of ionto  Slit cut to put around scabs - but still was little tender on the L    OT Treatments/Exercises (OP) - 06/19/21 0001       Iontophoresis   Type of Iontophoresis Dexamethasone    Location bilateral 1st dorsal compartment    Dose med patch , 1.5current -    Time 19      RUE Contrast Bath   Time 8 minutes    Comments wrist and thumb prior to soft tissue      LUE Contrast Bath   Time 8 minutes    Comments wrist and thumb - prior to soft tissue and ROM            done contrast and pt to cont  contrast 2-3 x day Done this date some graston tool nr 2 sweeping for wrist and foreram - tight and trigger points on radial forearms and volar- MC and Carpal spreads done - prior to pain free AAROM of thumb and wrist in all planes  Focus and add to HEP UD with thumb ADD  And  then thumb flexion and opposition to base of 5th  10 reps slight pull  Wrist flexion, ext ,RD , UD and thumb PA and RA pain free daily after contrast    Thumb spica fabricated for R in past and needed some modifications at thumb again this date around thenar eminence    L soft neoprene wrist and thumb wrap fitted with eval  bub add a hard thumb spica piece for L  - encourage for pt to wear most all the time Do some ice massage during day over 1st dorsal compartment And cont with modify activities         OT Education - 06/19/21 1726     Education Details splint weairng , ionto use , HEP and modifications    Person(s) Educated Patient    Methods Explanation;Demonstration;Tactile cues;Verbal cues;Handout    Comprehension Verbal cues required;Returned demonstration;Verbalized understanding                 OT Long Term Goals - 06/12/21 2036       OT LONG TERM GOAL #1   Title Pain in R and L wrist /thumb decrease to lessthan 2/10 with AROM in all planes to wean out of splints    Baseline pain 8/10 in R and L 4/10 - tenderness bilateral distal radius 8/10 - thumb spica on R hand/wrist 100% of time , neoprene wrist and thumb wrap on L    Time 5    Period Weeks    Status New    Target Date 06/26/21      OT LONG TERM GOAL #2   Title Pt to be independent in HEP in wearing of splints, do contrast and modifications to decrease pain less than 3/10    Baseline pain 8/10 on bilateral with AROM -and tenderness R 8/10 ; L 4/10    Time 3    Period Weeks    Status New    Target Date 06/12/21      OT LONG TERM GOAL #3   Title Pt to be able to wean out of splint and return to using bilateral wrist  and hands with modifications with symptoms less than 2/10    Baseline thumb spica on R amd L neoprene wrist and thumb wrap -and pain 8/10 with AROM thumb and wrist    Time 6    Period Weeks    Status New    Target Date 07/03/21                   Plan - 06/19/21 1726      Clinical Impression Statement Pt present at OT eval with bilateral wrist pain - R worse than L - started about 6 months ago with numbness in bilateral hands at night time -she did some manual on forearm and it got better - but then started having some thumb and wrist pain - 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 Show decreaes pain to 6/10 and increase AROM in all planes with less pain - pain still mostly with thumb flexion , and UD - Reinforce with pt importance of wearing thumb spica and cont to modifying her tasks.  - pt to cont AROM and cand do opposition to base of 5th and e  ionto with dexamethazone again.  Pt can benefit from skilled OT services for decrease pain , increase AROM and strength in bilateral hands to be able return to prior level of function - splinting , ionto , modifications to tasks , modalities and ther ex    OT Occupational Profile and History Problem Focused Assessment - Including review of records relating to presenting problem    Occupational performance deficits (Please refer to evaluation for details): ADL's;IADL's;Rest and Sleep;Leisure;Social Participation;Play;Work    Marketing executive / Function / Physical Skills ADL;Coordination;Flexibility;IADL;ROM;UE functional use;Pain;Dexterity    Rehab Potential Good    Clinical Decision Making Limited treatment options, no task modification necessary    Comorbidities Affecting Occupational Performance: None    Modification or Assistance to Complete Evaluation  No modification of tasks or assist necessary to complete eval    OT Frequency 2x / week    OT Duration 6 weeks    OT Treatment/Interventions Self-care/ADL training;Cryotherapy;Fluidtherapy;Contrast Bath;Therapeutic exercise;Manual Therapy;Splinting;DME and/or AE instruction    Consulted and Agree with Plan of Care  Patient             Patient will benefit from skilled therapeutic intervention in order to improve the following deficits and impairments:   Body Structure / Function / Physical Skills: ADL, Coordination, Flexibility, IADL, ROM, UE functional use, Pain, Dexterity       Visit Diagnosis: Radial styloid tenosynovitis (de quervain)  Pain in left wrist  Pain in right wrist  Stiffness of right wrist, not elsewhere classified    Problem List Patient Active Problem List   Diagnosis Date Noted   Neck nodule 11/04/2020   Teratoma of ovary, 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 [place], [address] Phone: [phone]   Fax:  [phone]  Name: Christne Amparo MRN: [id number] Date of Birth: 1976/05/26

## 2021-06-26 ENCOUNTER — Ambulatory Visit: Payer: 59 | Admitting: Occupational Therapy

## 2021-06-28 ENCOUNTER — Other Ambulatory Visit: Payer: Self-pay

## 2021-06-28 ENCOUNTER — Ambulatory Visit: Payer: 59 | Attending: Internal Medicine | Admitting: Occupational Therapy

## 2021-06-28 DIAGNOSIS — M25631 Stiffness of right wrist, not elsewhere classified: Secondary | ICD-10-CM | POA: Insufficient documentation

## 2021-06-28 DIAGNOSIS — M25532 Pain in left wrist: Secondary | ICD-10-CM | POA: Insufficient documentation

## 2021-06-28 DIAGNOSIS — M654 Radial styloid tenosynovitis [de Quervain]: Secondary | ICD-10-CM | POA: Insufficient documentation

## 2021-06-28 DIAGNOSIS — M25531 Pain in right wrist: Secondary | ICD-10-CM | POA: Insufficient documentation

## 2021-06-28 NOTE — Therapy (Signed)
[place] [facility] [address] [place], [address] Phone: [phone]   Fax:  [phone]  Occupational Therapy Screen  Patient Details  Name: Alicia Huang MRN: [id number] Date of Birth: 04/15/1976 Referring Provider (OT): DR Einar Pheasant   Encounter Date: 06/28/2021   OT End of Session - 06/28/21 1315     Visit Number 0             Past Medical History:  Diagnosis Date   Anemia    History of abnormal cervical Pap smear     Past Surgical History:  Procedure Laterality Date   colpolscopy     LAPAROSCOPIC OVARIAN CYSTECTOMY Right 10/17/2019   Procedure: LAPAROSCOPIC OVARIAN CYSTECTOMY;  Surgeon: Benjaman Kindler, MD;  Location: [facility];  Service: Gynecology;  Laterality: Right;   LAPAROSCOPY N/A 10/17/2019   Procedure: LAPAROSCOPY OPERATIVE;  Surgeon: Benjaman Kindler, MD;  Location: [facility];  Service: Gynecology;  Laterality: N/A;   NO PAST SURGERIES      There were no vitals filed for this visit.        Assess AROM for bilateral wrist and thumb - decrease pain bilateral And increase AROM for bilateral wrist and thumb with less pain in UD and thumb flexion -  R one less pain with finkelstein -and tenderness  L done some soft tissue - graston tool over EPL - proximal to 1st dorsal compartment- sweeping with tool nr 2             Pt some small scabs on  R wrist- ? If splint irritated or ionto -  Slit cut to put around scab  Pt to keep on for hour afterwards          Thumb spica for R  stretch thumb area and mold skin inside on bilateral splints at distal radial head area - for cushioning     And cont with modify activities                   OT Treatments/Exercises (OP) - 06/28/21 0001       Iontophoresis   Type of Iontophoresis Dexamethasone    Location bilateral 1st dorsal compartment    Dose med patch , 1.5current -    Time 19                          OT Long Term Goals - 06/12/21 2036       OT LONG TERM GOAL #1   Title Pain in R and L wrist /thumb decrease to lessthan 2/10 with AROM in all planes to wean out of splints    Baseline pain 8/10 in R and L 4/10 - tenderness bilateral distal radius 8/10 - thumb spica on R hand/wrist 100% of time , neoprene wrist and thumb wrap on L    Time 5    Period Weeks    Status New    Target Date 06/26/21      OT LONG TERM GOAL #2   Title Pt to be independent in HEP in wearing of splints, do contrast and modifications to decrease pain less than 3/10    Baseline pain 8/10 on bilateral with AROM -and tenderness R 8/10 ; L 4/10    Time 3    Period Weeks    Status New    Target Date 06/12/21      OT LONG TERM GOAL #3   Title Pt  to be able to wean out of splint and return to using bilateral wrist and hands with modifications with symptoms less than 2/10    Baseline thumb spica on R amd L neoprene wrist and thumb wrap -and pain 8/10 with AROM thumb and wrist    Time 6    Period Weeks    Status New    Target Date 07/03/21                    Patient will benefit from skilled therapeutic intervention in order to improve the following deficits and impairments:           Visit Diagnosis: Radial styloid tenosynovitis (de quervain)    Problem List Patient Active Problem List   Diagnosis Date Noted   Neck nodule 11/04/2020   Teratoma of ovary, 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, [place], [address] Phone: [phone]   Fax:  [phone]  Name: Jireh Teitelbaum MRN: [id number] Date of Birth:  Sep 26, 1976

## 2021-07-03 ENCOUNTER — Ambulatory Visit: Payer: 59 | Admitting: Occupational Therapy

## 2021-07-03 DIAGNOSIS — M25631 Stiffness of right wrist, not elsewhere classified: Secondary | ICD-10-CM | POA: Diagnosis not present

## 2021-07-03 DIAGNOSIS — M25532 Pain in left wrist: Secondary | ICD-10-CM | POA: Diagnosis not present

## 2021-07-03 DIAGNOSIS — M25531 Pain in right wrist: Secondary | ICD-10-CM | POA: Diagnosis not present

## 2021-07-03 DIAGNOSIS — M654 Radial styloid tenosynovitis [de Quervain]: Secondary | ICD-10-CM

## 2021-07-03 NOTE — Therapy (Signed)
[place] [facility] [address] [place], [address] Phone: [phone]   Fax:  [phone]  Occupational Therapy Treatment  Patient Details  Name: Alicia Huang MRN: [id number] Date of Birth: 28-Feb-1976 Referring Provider (OT): DR Einar Pheasant   Encounter Date: 07/03/2021   OT End of Session - 07/03/21 1644     Visit Number 6    Number of Visits 10    Date for OT Re-Evaluation 07/03/21    OT Start Time [id number]    OT Stop Time 1547    OT Time Calculation (min) 62 min    Activity Tolerance Patient tolerated treatment well    Behavior During Therapy [facility] for tasks assessed/performed             Past Medical History:  Diagnosis Date   Anemia    History of abnormal cervical Pap smear     Past Surgical History:  Procedure Laterality Date   colpolscopy     LAPAROSCOPIC OVARIAN CYSTECTOMY Right 10/17/2019   Procedure: LAPAROSCOPIC OVARIAN CYSTECTOMY;  Surgeon: Benjaman Kindler, MD;  Location: [facility];  Service: Gynecology;  Laterality: Right;   LAPAROSCOPY N/A 10/17/2019   Procedure: LAPAROSCOPY OPERATIVE;  Surgeon: Benjaman Kindler, MD;  Location: [facility];  Service: Gynecology;  Laterality: N/A;   NO PAST SURGERIES      There were no vitals filed for this visit.   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 Yes    Pain Score 6    L distal radius head and Finkelstein/ 2 on R   Pain Location Wrist                [facility] OT Assessment - 07/03/21 0001       Strength   Right Hand Grip (lbs) 60    Right Hand Lateral Pinch 18 lbs    Right Hand 3 Point Pinch 15 lbs    Left Hand Grip (lbs) 64    Left Hand Lateral Pinch 18 lbs    Left Hand 3 Point Pinch 14 lbs             AROM in bilateral wrist AROM - and pain free  Thumb flexion , RA ,PA pain free  But thumb flexion in combination with wrist UD or flexion - or pinch with wrist flexion - stil pain  Tenderness over R 2/10 distal radius head- and L 6/10  Finkelstein still positive 6/10 bilateral No pain with grip and prehension - see flowsheet    Pt some small scab on R distal radius -Slit cut to put around scab  Tolerating well         OT Treatments/Exercises (OP) - 07/03/21 0001  Iontophoresis   Type of Iontophoresis Dexamethasone    Location bilateral 1st dorsal compartment    Dose med patch ,2.0 current    Time 19              Pt cont contrast prior to coming in today  Done this date some graston tool nr 2 sweeping for wrist and foreram - tight and trigger points on radial forearms and volar- MC and Carpal spreads done - prior to pain free AAROM of thumb and wrist in all planes  Focus and add to HEP UD with thumb ADD And then thumb flexion and opposition to base of 5th 10 reps slight pull  And now stretch add with thumb flexion and UD   Wrist flexion, ext ,RD , UD and thumb PA and RA pain free daily after contrast   Thumb spica to cont to be wore most all the time Do some ice massage during day over 1st dorsal compartment And cont with modifying activities         OT Education - 07/03/21 1644     Education Details splint weairng , ionto use , HEP and modifications    Person(s) Educated Patient    Methods Explanation;Demonstration;Tactile cues;Verbal cues;Handout    Comprehension Verbal  cues required;Returned demonstration;Verbalized understanding                 OT Long Term Goals - 07/03/21 1853       OT LONG TERM GOAL #1   Title Pain in R and L wrist /thumb decrease to lessthan 2/10 with AROM in all planes to wean out of splints    Baseline pain 8/10 in R and L 4/10 - tenderness bilateral distal radius 8/10 - thumb spica on R hand/wrist 100% of time , neoprene wrist and thumb wrap on L- NOW pain decrease to 2-6/10 - cannot wean out of splint yet    Time 6    Period Weeks    Status On-going    Target Date 08/28/21      OT LONG TERM GOAL #2   Title Pt to be independent in HEP in wearing of splints, do contrast and modifications to decrease pain less than 3/10    Baseline pain 8/10 on bilateral with AROM -and tenderness R 8/10 ; L 4/10- NOW pain decrease to 2-6/10    Time 4    Period Weeks    Status On-going    Target Date 07/31/21      OT LONG TERM GOAL #3   Title Pt to be able to wean out of splint and return to using bilateral wrist and hands with modifications with symptoms less than 2/10    Baseline thumb spica on R amd L neoprene wrist and thumb wrap -and pain 8/10 with AROM thumb and wrist - NOW pain decrease but need thumb spica- pain now 2-6/10    Time 6    Status On-going    Target Date 08/28/21                   Plan - 07/03/21 1645     Clinical Impression Statement Pt present at OT eval with bilateral wrist pain - R worse than L - started about 6 months ago with numbness in bilateral hands at night time -she did some manual on forearm and it got better - but then started having some thumb and wrist pain - 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 Show decrease pain with wrist , thumb AROM in all planes - but still pain with combination movement of thumb flexion , pinch or  extention with R UD and flexion- grip and prehension this date within range for her  age and pain free- pt to work on Wrist UD, thumb flexion and combination stretch -  Reinforce with pt importance of wearing thumb spica and cont to modifying her tasks.  - pt to cont AROM in all planes HEP after contrast .  Pt can benefit from skilled OT services for decrease pain and increase AROM in bilateral hands/wrist- decrease to every other week    OT Occupational Profile and History Problem Focused Assessment - Including review of records relating to presenting problem    Occupational performance deficits (Please refer to evaluation for details): ADL's;IADL's;Rest and Sleep;Leisure;Social Participation;Play;Work    Marketing executive / Function / Physical Skills ADL;Coordination;Flexibility;IADL;ROM;UE functional use;Pain;Dexterity    Rehab Potential Good    Clinical Decision Making Limited treatment options, no task modification necessary    Comorbidities Affecting Occupational Performance: None    Modification or Assistance to Complete Evaluation  No modification of tasks or assist necessary to complete eval    OT Frequency 2x / week    OT Duration 6 weeks    OT Treatment/Interventions Self-care/ADL training;Cryotherapy;Fluidtherapy;Contrast Bath;Therapeutic exercise;Manual Therapy;Splinting;DME and/or AE instruction    Consulted and Agree with Plan of Care Patient             Patient will benefit from skilled therapeutic intervention in order to improve the following deficits and impairments:   Body Structure / Function / Physical Skills: ADL, Coordination, Flexibility, IADL, ROM, UE functional use, Pain, Dexterity       Visit Diagnosis: Radial styloid tenosynovitis (de quervain)  Pain in left wrist  Pain in right wrist  Stiffness of right wrist, not elsewhere classified    Problem List Patient Active Problem List   Diagnosis Date Noted   Neck nodule 11/04/2020   Teratoma of ovary,  right 10/21/2019   Breast lesion 03/07/2018   Healthcare maintenance 10/27/2016   History of abnormal cervical Pap smear 09/06/2016   History of anemia 09/06/2016   Vitamin D deficiency 09/06/2016   Family history of colonic polyps 09/06/2016    Rosalyn Gess OTR/L,CLT 07/03/2021, 6:59 PM  [place] [facility] [address] [place], [address] Phone: [phone]   Fax:  [phone]  Name: Divija Troutt MRN: [id number] Date of Birth: [birth year]

## 2021-07-10 ENCOUNTER — Encounter: Payer: 59 | Admitting: Occupational Therapy

## 2021-07-17 ENCOUNTER — Ambulatory Visit: Payer: 59 | Admitting: Occupational Therapy

## 2021-07-17 DIAGNOSIS — M654 Radial styloid tenosynovitis [de Quervain]: Secondary | ICD-10-CM | POA: Diagnosis not present

## 2021-07-17 DIAGNOSIS — M25532 Pain in left wrist: Secondary | ICD-10-CM | POA: Diagnosis not present

## 2021-07-17 DIAGNOSIS — M25631 Stiffness of right wrist, not elsewhere classified: Secondary | ICD-10-CM | POA: Diagnosis not present

## 2021-07-17 DIAGNOSIS — M25531 Pain in right wrist: Secondary | ICD-10-CM

## 2021-07-17 NOTE — Therapy (Signed)
[place] [facility] [address] [place], [address] Phone: [phone]   Fax:  [phone]  Occupational Therapy Treatment  Patient Details  Name: Alicia Huang MRN: [id number] Date of Birth: [birth year] Referring Provider (OT): DR Einar Pheasant   Encounter Date: 07/17/2021   OT End of Session - 07/17/21 1831     Visit Number 7    Number of Visits 10    Date for OT Re-Evaluation 08/28/21    OT Start Time [id number]    OT Stop Time 1530    OT Time Calculation (min) 45 min    Activity Tolerance Patient tolerated treatment well    Behavior During Therapy [facility] for tasks assessed/performed             Past Medical History:  Diagnosis Date   Anemia    History of abnormal cervical Pap smear     Past Surgical History:  Procedure Laterality Date   colpolscopy     LAPAROSCOPIC OVARIAN CYSTECTOMY Right 10/17/2019   Procedure: LAPAROSCOPIC OVARIAN CYSTECTOMY;  Surgeon: Benjaman Kindler, MD;  Location: [facility] ORS;  Service: Gynecology;  Laterality: Right;   LAPAROSCOPY N/A 10/17/2019   Procedure: LAPAROSCOPY OPERATIVE;  Surgeon: Benjaman Kindler, MD;  Location: [facility] ORS;  Service: Gynecology;  Laterality: N/A;   NO PAST SURGERIES      There were no vitals filed for this visit.   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 Yes    Pain Score 6     Pain Location Wrist                [facility] OT Assessment - 07/17/21 0001       AROM   Right Wrist Extension 73 Degrees    Right Wrist Flexion 105 Degrees    Right Wrist Radial Deviation 20 Degrees    Right Wrist Ulnar Deviation 35 Degrees    Left Wrist Extension 73 Degrees    Left Wrist Flexion 85 Degrees    Left Wrist Radial Deviation 25 Degrees    Left Wrist Ulnar Deviation 40 Degrees              AROM in bilateral wrist AROM - and pain free  Thumb flexion , RA ,PA pain free  But thumb flexion in combination with wrist UD or flexion - or pinch with wrist flexion - stil pain  No Tenderness over distal radius head Finkelstein still positive 6/10 bilateral No pain with grip and prehension            done some heat prior to soft tissue  and stretches - pt to do at home  Done this date some graston tool nr 2  sweeping  radial forearms, volar and dorsal- MC and Carpal spreads done - prior to pain free AAROM of thumb and wrist in all planes  Focus and add to HEP UD with thumb ADD And then thumb flexion and opposition to base of 5th 10 reps slight pull  And now stretch add with thumb flexion and UD /Finkelstein stretch     Thumb spica to cont to be wore most all the time at work  Do some ice massage during day over 1st dorsal compartment Use heat for stretches for thumb composite flexion ,UD and then combination              skin check done prior and pt tolerate well - keep on for hour    OT Treatments/Exercises (OP) - 07/17/21 0001       Iontophoresis   Type of Iontophoresis Dexamethasone    Location bilateral 1st dorsal compartment    Dose med patch ,2.0 current    Time 19                    OT Education - 07/17/21 1831     Education Details splint weairng , ionto use , HEP and modifications    Person(s) Educated Patient    Methods Explanation;Demonstration;Tactile  cues;Verbal cues;Handout    Comprehension Verbal cues required;Returned demonstration;Verbalized understanding                 OT Long Term Goals - 07/17/21 1835       OT LONG TERM GOAL #1   Title Pain in R and L wrist /thumb decrease to lessthan 2/10 with AROM in all planes to wean out of splints    Baseline pain 8/10 in R and L 4/10 - tenderness bilateral distal radius 8/10 - thumb spica on R hand/wrist 100% of time , neoprene wrist and thumb wrap on L- NOW no tenderness over daitsla radiuds but [place] -6/10 wean out of splint evening    Time 6    Period Weeks    Status On-going    Target Date 08/28/21      OT LONG TERM GOAL #2   Title Pt to be independent in HEP in wearing of splints, do contrast and modifications to decrease pain less than 3/10    Baseline pain 8/10 on bilateral with AROM -and tenderness R 8/10 ; L 4/10- NOW pain decrease to 0/10 withAROM and tenderness -ut Finkelstein and tightenss 6/10    Time 6    Period Weeks    Status On-going    Target Date 08/28/21      OT LONG TERM GOAL #3   Title Pt to be able to wean out of splint and return to using bilateral wrist and hands with modifications with symptoms less than 2/10    Baseline thumb spica on R amd L neoprene wrist and thumb wrap -and pain 8/10 with AROM thumb and wrist - NOW pain decrease but need thumb spica during day at work , or taping [place] or composite flexion of thumb and UD -6/10 - wean out of splint after work    Time 6    Period Weeks    Status On-going    Target Date 08/28/21                   Plan - 07/17/21 1832     Clinical Impression Statement Pt present at OT eval with bilateral wrist pain - R worse than L -  started about 8 months ago with numbness in bilateral hands at night time -she did some manual on forearm and it got better - but then started having some thumb and wrist pain - 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 - Pt  made great progress and is about 60% improved with no tenderness over distal radius head, but pain with composite flexion of thumb with wrist UD - and positiive Finkelstein for 3-6/10 - pt to wear thumb spica with activiites that cause pain but HEP to use heat for soft tissue over radial wrist and forearm and stretches for UD , thumb flexion and compostie flexion  Show decrease pain with wrist , thumb AROM in all planes - but still pain with combination movement of thumb flexion , pinch or extention with R UD and flexion- grip and prehension within range for her  age and pain free- pt to work on Wrist UD, thumb flexion and combination stretch -  Reinforce with pt importance of wearing thumb spica with actiivities that cause pain more thn 2/10   - pt to cont AROM/PROM  in all planes HEP after contrast .  Pt can benefit from skilled OT services for decrease pain and increase AROM in bilateral hands/wrist- decrease to every other week    OT Occupational Profile and History Problem Focused Assessment - Including review of records relating to presenting problem    Occupational performance deficits (Please refer to evaluation for details): ADL's;IADL's;Rest and Sleep;Leisure;Social Participation;Play;Work    Marketing executive / Function / Physical Skills ADL;Coordination;Flexibility;IADL;ROM;UE functional use;Pain;Dexterity    Rehab Potential Good    Clinical Decision Making Limited treatment options, no task modification necessary    Comorbidities Affecting Occupational Performance: None    Modification or Assistance to Complete Evaluation  No modification of tasks or assist necessary to complete eval    OT Frequency Biweekly    OT Duration 6 weeks    OT Treatment/Interventions Self-care/ADL training;Cryotherapy;Fluidtherapy;Contrast Bath;Therapeutic exercise;Manual Therapy;Splinting;DME and/or AE instruction    Consulted and Agree with Plan of Care Patient             Patient will benefit from skilled  therapeutic intervention in order to improve the following deficits and impairments:   Body Structure / Function / Physical Skills: ADL, Coordination, Flexibility, IADL, ROM, UE functional use, Pain, Dexterity       Visit Diagnosis: Radial styloid tenosynovitis (de quervain)  Pain in left wrist  Pain in right wrist  Stiffness of right wrist, not elsewhere classified    Problem List Patient Active Problem List   Diagnosis Date Noted   Neck nodule 11/04/2020   Teratoma of ovary, 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, [place], [address] Phone: [phone]   Fax:  [phone]  Name: Alicia Huang MRN: [id number] Date of Birth: 1976/07/25

## 2021-07-31 ENCOUNTER — Ambulatory Visit: Payer: 59 | Admitting: Occupational Therapy

## 2021-08-13 ENCOUNTER — Ambulatory Visit: Payer: 59 | Attending: Internal Medicine | Admitting: Occupational Therapy

## 2021-09-04 ENCOUNTER — Encounter: Payer: 59 | Admitting: Occupational Therapy

## 2021-09-11 ENCOUNTER — Encounter: Payer: 59 | Admitting: Occupational Therapy

## 2021-09-16 ENCOUNTER — Ambulatory Visit: Payer: 59 | Attending: Internal Medicine | Admitting: Occupational Therapy

## 2021-09-16 DIAGNOSIS — M25631 Stiffness of right wrist, not elsewhere classified: Secondary | ICD-10-CM | POA: Diagnosis not present

## 2021-09-16 DIAGNOSIS — M25531 Pain in right wrist: Secondary | ICD-10-CM | POA: Diagnosis not present

## 2021-09-16 DIAGNOSIS — M25532 Pain in left wrist: Secondary | ICD-10-CM | POA: Diagnosis not present

## 2021-09-16 NOTE — Therapy (Signed)
[place] [facility] [address] [place], [address] Phone: [phone]   Fax:  [phone]  Occupational Therapy Treatment  Patient Details  Name: Alicia Huang MRN: [id number] Date of Birth: 1976-01-16 Referring Provider (OT): DR Einar Pheasant   Encounter Date: 09/16/2021   OT End of Session - 09/16/21 1502     Visit Number 1    Number of Visits 3    Date for OT Re-Evaluation 11/11/21    OT Start Time 1316    OT Stop Time 1346    OT Time Calculation (min) 30 min    Activity Tolerance Patient tolerated treatment well    Behavior During Therapy [facility] for tasks assessed/performed             Past Medical History:  Diagnosis Date   Anemia    History of abnormal cervical Pap smear     Past Surgical History:  Procedure Laterality Date   colpolscopy     LAPAROSCOPIC OVARIAN CYSTECTOMY Right 10/17/2019   Procedure: LAPAROSCOPIC OVARIAN CYSTECTOMY;  Surgeon: Benjaman Kindler, MD;  Location: [facility] OR[facility];  Service: Gynecology;  Laterality: Right;   LAPAROSCOPY N/A 10/17/2019   Procedure: LAPAROSCOPY OPERATIVE;  Surgeon: Benjaman Kindler, MD;  Location: [facility] ORS;  Service: Gynecology;  Laterality: N/A;   NO PAST SURGERIES      There were no vitals filed for this visit.   Subjective Assessment - 09/16/21 1459     Subjective  I am doing better- only wearing my hard splints when doing manual on my pt's - about 50-75% of the time and driving- if not wearing the pain increase 3-4/10- off days not wearing splints really and can cook and lift my heavy iron bowl- can do yard work- want you to look at my measurements and see if I can start doing more active actvities and working out    Pertinent History Pt had bilateral numbness since about 6 months ago - mostly night time - done massages and worked herself on her forearms and neck /cervical strengthening -and got better- but pain started then in thumbs and wrist - with  anymovement - 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 No/denies                [facility] OT Assessment - 09/16/21 0001       Strength   Right Hand Grip (lbs) 53    Right Hand Lateral Pinch 17 lbs    Right Hand 3 Point Pinch 13 lbs    Left Hand Grip (lbs) 56    Left Hand Lateral Pinch 19 lbs    Left Hand 3 Point Pinch 16 lbs              Assess pt returning for AROM , strength and pain -  AROM in L wrist and thumb WNL and pain 0-2/10 No tenderness and Finkelstein negative on the L   No Tenderness on R -but unable to do composite UD and thumb flexion-  Positive Finkelstein Thumb AROM decrease and pain in end range opposition to base of 5th and thumb RA end range Pain 3-4/10 on the R  Pt to cont  with HEP for webspace massage and PROM thumb RA on R  And wall and table slides for wrist extention , wall pushups pain free Composite UD and thumb ADD and flexion stretches after heat- pain free  Pt to return gradually to working out , [employer] , gardening, yoga                  OT Education - 09/16/21 1501     Education Details progress and changes to HEP and return to activities    Person(s) Educated Patient    Methods Explanation;Demonstration;Tactile cues;Verbal cues;Handout    Comprehension Verbal cues required;Returned demonstration;Verbalized understanding                 OT Long Term Goals - 09/16/21 1516       OT LONG TERM GOAL #1   Title Pain in R and L wrist /thumb decrease to lessthan 2/10 with AROM in all planes to wean out of splints    Baseline pain 8/10 in R and L 4/10 - tenderness bilateral distal radius 8/10 - thumb spica on R hand/wrist 100% of time , neoprene wrist and thumb wrap on L- NOW pain decrease to 0-2/10 L and AROM WNL , R  3-4/10 at the worse and pain thumb RA end range and wrist ext with weight bearing / composite UD    Time 8    Period Weeks    Status On-going    Target Date 11/11/21      OT LONG TERM GOAL #2   Title Pt to be independent in HEP in wearing of splints, do contrast and modifications to decrease pain less than 3/10    Status Achieved      OT LONG TERM GOAL #3   Title Pt to be able to wean out of splint and return to using bilateral wrist and hands with modifications with symptoms less than 2/10    Baseline NOW only wearing thumb spica days at work with manual therapy on pt's and some when driving    Status Achieved                   Plan - 09/16/21 1503     Clinical Impression Statement Pt present on 05/22/21 at OT eval with bilateral wrist pain - R worse than L - started about Nov 2021 with numbness in bilateral hands at night time -she did some manual on forearm and it got better - but then started having some thumb and wrist pain - 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 Pt show great progress in L wrist and thumb AROM - To about WNL and no pain. R she do show decrease thumb RA , and pain with decrease composite flexion of thumb and wrist UD. - HEP change for pt to use heat  on the R wrist and forearm prior to PROM thumb RA , webspace massage , as well as stretch for UD with thumb in ADD and then in flexion as pain and tightness improve. Her grip decrease little in R and L, and prehension on the R but pain free, L prehension increase- Pt to return gradually to work out like yoga, kayaking , working out - but keep wrist neutral and thumb  not in RA - work on wrist ext with weight bearing on wall for R - pt to cont at home with homeprogram and contact me as needed for follow up    OT  Occupational Profile and History Problem Focused Assessment - Including review of records relating to presenting problem    Occupational performance deficits (Please refer to evaluation for details): ADL's;IADL's;Rest and Sleep;Leisure;Social Participation;Play;Work    Marketing executive / Function / Physical Skills ADL;Coordination;Flexibility;IADL;ROM;UE functional use;Pain;Dexterity    Rehab Potential Good    Clinical Decision Making Limited treatment options, no task modification necessary    Comorbidities Affecting Occupational Performance: None    Modification or Assistance to Complete Evaluation  No modification of tasks or assist necessary to complete eval    OT Frequency Monthly    OT Duration 8 weeks    OT Treatment/Interventions Self-care/ADL training;Cryotherapy;Fluidtherapy;Contrast Bath;Therapeutic exercise;Manual Therapy;Splinting;DME and/or AE instruction    Consulted and Agree with Plan of Care Patient             Patient will benefit from skilled therapeutic intervention in order to improve the following deficits and impairments:   Body Structure / Function / Physical Skills: ADL, Coordination, Flexibility, IADL, ROM, UE functional use, Pain, Dexterity       Visit Diagnosis: Pain in right wrist - Plan: Ot plan of care cert/re-cert  Pain in left wrist - Plan: Ot plan of care cert/re-cert  Stiffness of right wrist, not elsewhere classified - Plan: Ot plan of care cert/re-cert    Problem List Patient Active Problem List   Diagnosis Date Noted   Neck nodule 11/04/2020   Teratoma of ovary, 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 [place], [address] Phone: [phone]   Fax:  [phone]  Name: Anderia Lorenzo MRN: [id number] Date of Birth: 02-Apr-1976

## 2021-10-09 DIAGNOSIS — Z114 Encounter for screening for human immunodeficiency virus [HIV]: Secondary | ICD-10-CM | POA: Diagnosis not present

## 2021-10-09 DIAGNOSIS — Z113 Encounter for screening for infections with a predominantly sexual mode of transmission: Secondary | ICD-10-CM | POA: Diagnosis not present

## 2021-10-09 DIAGNOSIS — Z124 Encounter for screening for malignant neoplasm of cervix: Secondary | ICD-10-CM | POA: Diagnosis not present

## 2021-10-09 DIAGNOSIS — Z01419 Encounter for gynecological examination (general) (routine) without abnormal findings: Secondary | ICD-10-CM | POA: Diagnosis not present

## 2021-10-09 DIAGNOSIS — Z1151 Encounter for screening for human papillomavirus (HPV): Secondary | ICD-10-CM | POA: Diagnosis not present

## 2021-10-09 LAB — HM PAP SMEAR: HM Pap smear: NEGATIVE

## 2021-10-23 ENCOUNTER — Encounter: Payer: Self-pay | Admitting: Internal Medicine

## 2021-10-23 DIAGNOSIS — R200 Anesthesia of skin: Secondary | ICD-10-CM

## 2021-10-23 NOTE — Telephone Encounter (Signed)
Order placed for c-spine xray 

## 2021-11-14 ENCOUNTER — Ambulatory Visit
Admission: RE | Admit: 2021-11-14 | Discharge: 2021-11-14 | Disposition: A | Discharge status: Home / Self Care | Payer: 59 | Attending: Internal Medicine | Admitting: Internal Medicine

## 2021-11-14 ENCOUNTER — Ambulatory Visit
Admission: RE | Admit: 2021-11-14 | Discharge: 2021-11-14 | Disposition: A | Discharge status: Home / Self Care | Payer: 59 | Source: Ambulatory Visit | Attending: Internal Medicine | Admitting: Internal Medicine

## 2021-11-14 DIAGNOSIS — M542 Cervicalgia: Secondary | ICD-10-CM | POA: Diagnosis not present

## 2021-11-14 DIAGNOSIS — R200 Anesthesia of skin: Secondary | ICD-10-CM | POA: Insufficient documentation

## 2021-11-20 DIAGNOSIS — D259 Leiomyoma of uterus, unspecified: Secondary | ICD-10-CM | POA: Diagnosis not present

## 2021-11-20 DIAGNOSIS — Z86018 Personal history of other benign neoplasm: Secondary | ICD-10-CM | POA: Diagnosis not present

## 2021-12-04 DIAGNOSIS — D259 Leiomyoma of uterus, unspecified: Secondary | ICD-10-CM | POA: Diagnosis not present

## 2021-12-04 DIAGNOSIS — T8332XD Displacement of intrauterine contraceptive device, subsequent encounter: Secondary | ICD-10-CM | POA: Diagnosis not present

## 2021-12-04 DIAGNOSIS — N921 Excessive and frequent menstruation with irregular cycle: Secondary | ICD-10-CM | POA: Diagnosis not present

## 2021-12-04 DIAGNOSIS — R102 Pelvic and perineal pain: Secondary | ICD-10-CM | POA: Diagnosis not present

## 2021-12-18 ENCOUNTER — Telehealth: Payer: Self-pay

## 2021-12-18 NOTE — Telephone Encounter (Signed)
LM for patient to call back.

## 2021-12-20 ENCOUNTER — Encounter: Payer: 59 | Admitting: Internal Medicine

## 2022-01-02 ENCOUNTER — Encounter: Payer: Self-pay | Admitting: Internal Medicine

## 2022-01-17 ENCOUNTER — Encounter: Payer: 59 | Admitting: Internal Medicine

## 2022-03-14 ENCOUNTER — Ambulatory Visit (INDEPENDENT_AMBULATORY_CARE_PROVIDER_SITE_OTHER): Payer: 59 | Admitting: Internal Medicine

## 2022-03-14 ENCOUNTER — Encounter: Payer: Self-pay | Admitting: Internal Medicine

## 2022-03-14 VITALS — BP 100/60 | HR 75 | Temp 98.5°F | Resp 12 | Ht 61.0 in | Wt 113.0 lb

## 2022-03-14 DIAGNOSIS — Z8742 Personal history of other diseases of the female genital tract: Secondary | ICD-10-CM

## 2022-03-14 DIAGNOSIS — E559 Vitamin D deficiency, unspecified: Secondary | ICD-10-CM | POA: Diagnosis not present

## 2022-03-14 DIAGNOSIS — Z0000 Encounter for general adult medical examination without abnormal findings: Secondary | ICD-10-CM

## 2022-03-14 DIAGNOSIS — Z13220 Encounter for screening for lipoid disorders: Secondary | ICD-10-CM | POA: Diagnosis not present

## 2022-03-14 DIAGNOSIS — D72819 Decreased white blood cell count, unspecified: Secondary | ICD-10-CM | POA: Diagnosis not present

## 2022-03-14 DIAGNOSIS — Z862 Personal history of diseases of the blood and blood-forming organs and certain disorders involving the immune mechanism: Secondary | ICD-10-CM

## 2022-03-14 NOTE — Progress Notes (Signed)
Patient ID: Alicia Huang, female   DOB: 11-29-1975, 46 y.o.   MRN: [id number] ? ? ?Subjective:  ? ? Patient ID: Alicia Huang, female    DOB: [birth year], 46 y.o.   MRN: [id number] ? ?This visit occurred during the SARS-CoV-2 public health emergency.  Safety protocols were in place, including screening questions prior to the visit, additional usage of staff PPE, and extensive cleaning of exam room while observing appropriate contact time as indicated for disinfecting solutions.  ? ?Patient here for her physical exam.  ? ?Chief Complaint  ?Patient presents with  ? Annual Exam  ?  CPE  ? .  ? ?HPI ?Sees gyn for pelvic and pap smears.  Had pelvic ultrasound - no adnexal masses, fibroids unchanged.  IUD in proper location.  She reports she is doing well.  Feels good.  Stays active.  Exercises regularly.  No chest pain or sob reported.  No abdominal pain or bowel change reported.   ? ? ?Past Medical History:  ?Diagnosis Date  ? Anemia   ? History of abnormal cervical Pap smear   ? ?Past Surgical History:  ?Procedure Laterality Date  ? colpolscopy    ? LAPAROSCOPIC OVARIAN CYSTECTOMY Right 10/17/2019  ? Procedure: LAPAROSCOPIC OVARIAN CYSTECTOMY;  Surgeon: Benjaman Kindler, MD;  Location: [facility];  Service: Gynecology;  Laterality: Right;  ? LAPAROSCOPY N/A 10/17/2019  ? Procedure: LAPAROSCOPY OPERATIVE;  Surgeon: Benjaman Kindler, MD;  Location: [facility];  Service: Gynecology;  Laterality: N/A;  ? NO PAST SURGERIES    ? ?Family History  ?Problem Relation Age of Onset  ? Hyperlipidemia Mother   ? Hyperlipidemia Father   ? Hypertension Father   ? Cancer Father   ? Breast cancer Neg Hx   ? ?Social History  ? ?Socioeconomic History  ? Marital status: Single  ?  Spouse name: Not on file  ? Number of children: Not on file  ? Years of education: Not on file  ? Highest education level: Not on file  ?Occupational History  ? Not on file  ?Tobacco Use  ? Smoking status: Never  ? Smokeless tobacco: Never  ?Vaping Use  ? Vaping  Use: Never used  ?Substance and Sexual Activity  ? Alcohol use: Yes  ?  Comment: SOCIAL  ? Drug use: No  ? Sexual activity: Not on file  ?Other Topics Concern  ? Not on file  ?Social History Narrative  ? Not on file  ? ?Social Determinants of Health  ? ?Financial Resource Strain: Not on file  ?Food Insecurity: Not on file  ?Transportation Needs: Not on file  ?Physical Activity: Not on file  ?Stress: Not on file  ?Social Connections: Not on file  ? ? ? ?Review of Systems  ?Constitutional:  Negative for appetite change and unexpected weight change.  ?HENT:  Negative for congestion, sinus pressure and sore throat.   ?Eyes:  Negative for pain and visual disturbance.  ?Respiratory:  Negative for cough, chest tightness and shortness of breath.   ?Cardiovascular:  Negative for chest pain, palpitations and leg swelling.  ?Gastrointestinal:  Negative for abdominal pain, diarrhea, nausea and vomiting.  ?Genitourinary:  Negative for difficulty urinating and dysuria.  ?Musculoskeletal:  Negative for joint swelling and myalgias.  ?Skin:  Negative for color change and rash.  ?Neurological:  Negative for dizziness, light-headedness and headaches.  ?Hematological:  Negative for adenopathy. Does not bruise/bleed easily.  ?Psychiatric/Behavioral:  Negative for agitation and dysphoric mood.   ? ?   ?Objective:  ?  ? ?  BP 100/60 (BP Location: Left Arm, Patient Position: Sitting, Cuff Size: Small)   Pulse 75   Temp 98.5 ?F (36.9 ?C) (Temporal)   Resp 12   Ht '5\' 1"'$  (1.549 m)   Wt 113 lb (51.3 kg)   SpO2 96%   BMI 21.35 kg/m?  ?Wt Readings from Last 3 Encounters:  ?03/14/22 113 lb (51.3 kg)  ?12/17/20 112 lb (50.8 kg)  ?10/29/20 107 lb 9.6 oz (48.8 kg)  ? ? ?Physical Exam ?Vitals reviewed.  ?Constitutional:   ?   General: She is not in acute distress. ?   Appearance: Normal appearance. She is well-developed.  ?HENT:  ?   Head: Normocephalic and atraumatic.  ?   Right Ear: External ear normal.  ?   Left Ear: External ear normal.   ?Eyes:  ?   General: No scleral icterus.    ?   Right eye: No discharge.     ?   Left eye: No discharge.  ?   Conjunctiva/sclera: Conjunctivae normal.  ?Neck:  ?   Thyroid: No thyromegaly.  ?Cardiovascular:  ?   Rate and Rhythm: Normal rate and regular rhythm.  ?Pulmonary:  ?   Effort: No tachypnea, accessory muscle usage or respiratory distress.  ?   Breath sounds: Normal breath sounds. No decreased breath sounds or wheezing.  ?Chest:  ?Breasts: ?   Right: No inverted nipple, mass, nipple discharge or tenderness (no axillary adenopathy).  ?   Left: No inverted nipple, mass, nipple discharge or tenderness (no axilarry adenopathy).  ?Abdominal:  ?   General: Bowel sounds are normal.  ?   Palpations: Abdomen is soft.  ?   Tenderness: There is no abdominal tenderness.  ?Musculoskeletal:     ?   General: No swelling or tenderness.  ?   Cervical back: Neck supple.  ?Lymphadenopathy:  ?   Cervical: No cervical adenopathy.  ?Skin: ?   Findings: No erythema or rash.  ?Neurological:  ?   Mental Status: She is alert and oriented to person, place, and time.  ?Psychiatric:     ?   Mood and Affect: Mood normal.     ?   Behavior: Behavior normal.  ? ? ? ?Outpatient Encounter Medications as of 03/14/2022  ?Medication Sig  ? cholecalciferol (VITAMIN D3) 25 MCG (1000 UT) tablet Take 1,000 Units by mouth daily.  ? levonorgestrel (MIRENA) 20 MCG/24HR IUD by Intrauterine route.  ? Multiple Vitamins-Minerals (IMMUNE SUPPORT PO) Take 1 Scoop by mouth daily.  ? vitamin B-12 (CYANOCOBALAMIN) 500 MCG tablet Take 500 mcg by mouth daily.  ? [DISCONTINUED] ferrous sulfate 300 (60 Fe) MG/5ML syrup Take 300 mg by mouth daily with breakfast.   ? [DISCONTINUED] COLLAGEN PO Take 1 Scoop by mouth daily. (Patient not taking: Reported on 03/14/2022)  ? [DISCONTINUED] ELDERBERRY PO Take 1 capsule by mouth daily. (Patient not taking: Reported on 03/14/2022)  ? ?No facility-administered encounter medications on file as of 03/14/2022.  ?  ? ?Lab Results   ?Component Value Date  ? WBC 4.8 03/27/2021  ? HGB 12.8 03/27/2021  ? HCT 38.5 03/27/2021  ? PLT 233.0 03/27/2021  ? GLUCOSE 83 12/21/2020  ? CHOL 193 12/21/2020  ? TRIG 50.0 12/21/2020  ? HDL 91.10 12/21/2020  ? [place] 92 12/21/2020  ? ALT 10 12/21/2020  ? AST 14 12/21/2020  ? NA 141 12/21/2020  ? K 4.0 12/21/2020  ? CL 105 12/21/2020  ? CREATININE 0.59 12/21/2020  ? BUN 13 12/21/2020  ? CO2  32 12/21/2020  ? TSH 3.40 12/21/2020  ? ? ?DG Cervical Spine 2 or 3 views ? ?Result Date: 11/15/2021 ?CLINICAL DATA:  Bilateral hand numbness and neck pain for 1 year. No injury. EXAM: CERVICAL SPINE - 2-3 VIEW COMPARISON:  None. FINDINGS: The cervical spine is visualized from the occiput to the cervicothoracic junction. Mild straightening of the normal cervical lordosis. Alignment is otherwise anatomic. Vertebral body and disc space height are maintained. Prevertebral soft tissues are within normal limits. Mild multilevel uncovertebral hypertrophy. Minimal anterior marginal osteophytosis at C4-5 and C5-6. Visualized lung apices are clear. IMPRESSION: 1. Mild straightening of the normal cervical lordosis. 2. Mild multilevel uncovertebral hypertrophy with minimal anterior marginal osteophytosis at C4-5 and C5-6. Electronically Signed   By: Lorin Picket M.D.   On: 11/15/2021 09:14  ? ? ?   ?Assessment & Plan:  ? ?Problem List Items Addressed This Visit   ? ? Healthcare maintenance  ?  Physical today 03/14/22.  Sees gyn for pelvic and pap smears.  Up to date.  Mammogram 03/27/21 - birads I.  Continue yearly mammograms.  Family history of polyps.  Discussed colon screening ? ?  ?  ? History of abnormal cervical Pap smear  ?  Followed by gyn.  Up to date.   ? ?  ?  ? History of anemia  ?  Check cbc and B12 and folate.  Follow.  ? ?  ?  ? Relevant Orders  ? CBC w/Diff  ? Basic Metabolic Panel (BMET)  ? Lipid Profile  ? Hepatic function panel  ? TSH  ? Vitamin D (25 hydroxy)  ? B12 and Folate Panel  ? Vitamin D deficiency  ?   History of vitamin D deficiency.  Recheck with next labs.   ? ?  ?  ? Relevant Orders  ? CBC w/Diff  ? Basic Metabolic Panel (BMET)  ? Lipid Profile  ? Hepatic function panel  ? TSH  ? Vitamin D (25 hydroxy)  ? B12 and [place]

## 2022-03-23 ENCOUNTER — Encounter: Payer: Self-pay | Admitting: Internal Medicine

## 2022-03-23 NOTE — Assessment & Plan Note (Signed)
Check cbc and B12 and folate.  Follow.  ?

## 2022-03-23 NOTE — Assessment & Plan Note (Signed)
Followed by gyn.  Up to date.   ?

## 2022-03-23 NOTE — Assessment & Plan Note (Signed)
History of vitamin D deficiency.  Recheck with next labs.   ?

## 2022-03-23 NOTE — Assessment & Plan Note (Signed)
Physical today 03/14/22.  Sees gyn for pelvic and pap smears.  Up to date.  Mammogram 03/27/21 - birads I.  Continue yearly mammograms.  Family history of polyps.  Discussed colon screening ?

## 2022-04-18 ENCOUNTER — Telehealth: Payer: Self-pay | Admitting: Internal Medicine

## 2022-04-18 ENCOUNTER — Other Ambulatory Visit
Admission: RE | Admit: 2022-04-18 | Discharge: 2022-04-18 | Disposition: A | Discharge status: Home / Self Care | Payer: 59 | Attending: Internal Medicine | Admitting: Internal Medicine

## 2022-04-18 DIAGNOSIS — Z13220 Encounter for screening for lipoid disorders: Secondary | ICD-10-CM | POA: Insufficient documentation

## 2022-04-18 DIAGNOSIS — E559 Vitamin D deficiency, unspecified: Secondary | ICD-10-CM | POA: Insufficient documentation

## 2022-04-18 DIAGNOSIS — Z862 Personal history of diseases of the blood and blood-forming organs and certain disorders involving the immune mechanism: Secondary | ICD-10-CM | POA: Diagnosis not present

## 2022-04-18 DIAGNOSIS — D72819 Decreased white blood cell count, unspecified: Secondary | ICD-10-CM | POA: Insufficient documentation

## 2022-04-18 LAB — CBC WITH DIFFERENTIAL/PLATELET
Abs Immature Granulocytes: 0.01 10*3/uL (ref 0.00–0.07)
Basophils Absolute: 0 10*3/uL (ref 0.0–0.1)
Basophils Relative: 0 %
Eosinophils Absolute: 0.2 10*3/uL (ref 0.0–0.5)
Eosinophils Relative: 3 %
HCT: 39.9 % (ref 36.0–46.0)
Hemoglobin: 12.9 g/dL (ref 12.0–15.0)
Immature Granulocytes: 0 %
Lymphocytes Relative: 20 %
Lymphs Abs: 1.2 10*3/uL (ref 0.7–4.0)
MCH: 29.7 pg (ref 26.0–34.0)
MCHC: 32.3 g/dL (ref 30.0–36.0)
MCV: 91.7 fL (ref 80.0–100.0)
Monocytes Absolute: 0.5 10*3/uL (ref 0.1–1.0)
Monocytes Relative: 9 %
Neutro Abs: 4 10*3/uL (ref 1.7–7.7)
Neutrophils Relative %: 68 %
Platelets: 256 10*3/uL (ref 150–400)
RBC: 4.35 MIL/uL (ref 3.87–5.11)
RDW: 11.9 % (ref 11.5–15.5)
WBC: 5.9 10*3/uL (ref 4.0–10.5)
nRBC: 0 % (ref 0.0–0.2)

## 2022-04-18 LAB — BASIC METABOLIC PANEL
Anion gap: 7 (ref 5–15)
BUN: 13 mg/dL (ref 6–20)
CO2: 28 mmol/L (ref 22–32)
Calcium: 9.4 mg/dL (ref 8.9–10.3)
Chloride: 105 mmol/L (ref 98–111)
Creatinine, Ser: 0.51 mg/dL (ref 0.44–1.00)
GFR, Estimated: >60 mL/min (ref >60)
Glucose, Bld: 90 mg/dL (ref 70–99)
Potassium: 4.2 mmol/L (ref 3.5–5.1)
Sodium: 140 mmol/L (ref 135–145)

## 2022-04-18 LAB — LIPID PANEL
Cholesterol: 201 mg/dL — ABNORMAL HIGH (ref 0–200)
HDL: 90 mg/dL (ref >40)
LDL Cholesterol: 104 mg/dL — ABNORMAL HIGH (ref 0–99)
Total CHOL/HDL Ratio: 2.2 RATIO
Triglycerides: 37 mg/dL (ref <150)
VLDL: 7 mg/dL (ref 0–40)

## 2022-04-18 LAB — HEPATIC FUNCTION PANEL
ALT: 12 U/L (ref 0–44)
AST: 17 U/L (ref 15–41)
Albumin: 3.9 g/dL (ref 3.5–5.0)
Alkaline Phosphatase: 53 U/L (ref 38–126)
Bilirubin, Direct: 0.1 mg/dL (ref 0.0–0.2)
Indirect Bilirubin: 0.6 mg/dL (ref 0.3–0.9)
Total Bilirubin: 0.7 mg/dL (ref 0.3–1.2)
Total Protein: 7 g/dL (ref 6.5–8.1)

## 2022-04-18 LAB — TSH: TSH: 1.48 u[IU]/mL (ref 0.350–4.500)

## 2022-04-18 LAB — VITAMIN D 25 HYDROXY (VIT D DEFICIENCY, FRACTURES): Vit D, 25-Hydroxy: 60.9 ng/mL (ref 30–100)

## 2022-04-18 NOTE — Telephone Encounter (Signed)
Pt want to know if provider ordered vitamin D lab

## 2022-04-23 NOTE — Telephone Encounter (Signed)
S/w pt - advised of results

## 2022-05-09 ENCOUNTER — Ambulatory Visit: Payer: 59 | Admitting: Internal Medicine

## 2022-05-29 ENCOUNTER — Other Ambulatory Visit: Payer: Self-pay | Admitting: Internal Medicine

## 2022-06-20 DIAGNOSIS — L578 Other skin changes due to chronic exposure to nonionizing radiation: Secondary | ICD-10-CM | POA: Diagnosis not present

## 2022-06-20 DIAGNOSIS — D2262 Melanocytic nevi of left upper limb, including shoulder: Secondary | ICD-10-CM | POA: Diagnosis not present

## 2022-06-20 DIAGNOSIS — L821 Other seborrheic keratosis: Secondary | ICD-10-CM | POA: Diagnosis not present

## 2022-06-20 DIAGNOSIS — D2261 Melanocytic nevi of right upper limb, including shoulder: Secondary | ICD-10-CM | POA: Diagnosis not present

## 2022-06-20 DIAGNOSIS — D225 Melanocytic nevi of trunk: Secondary | ICD-10-CM | POA: Diagnosis not present

## 2022-06-20 DIAGNOSIS — L814 Other melanin hyperpigmentation: Secondary | ICD-10-CM | POA: Diagnosis not present

## 2022-06-29 IMAGING — MG MM DIGITAL SCREENING BILAT W/ TOMO AND CAD
8 series · 9 of 24 positions shown · non-contrast
Comparison: Previous exam(s).

CLINICAL DATA: Screening.

EXAM:
DIGITAL SCREENING BILATERAL MAMMOGRAM WITH TOMOSYNTHESIS AND CAD
TECHNIQUE: Bilateral screening digital craniocaudal and mediolateral oblique
mammograms were obtained. Bilateral screening digital breast
tomosynthesis was performed. The images were evaluated with
computer-aided detection.

[R MLO synth-2D]
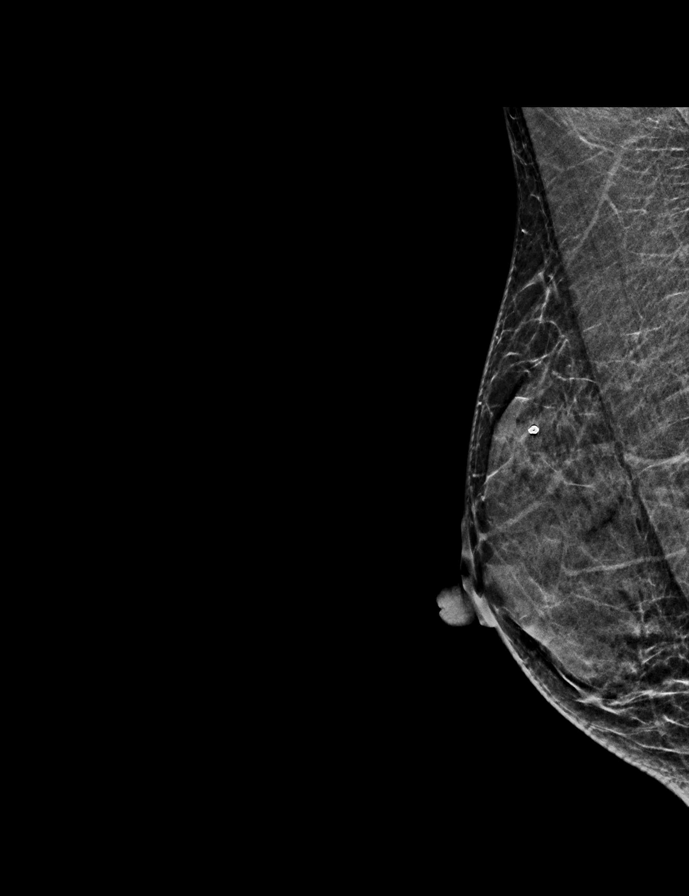

[R CC synth-2D]
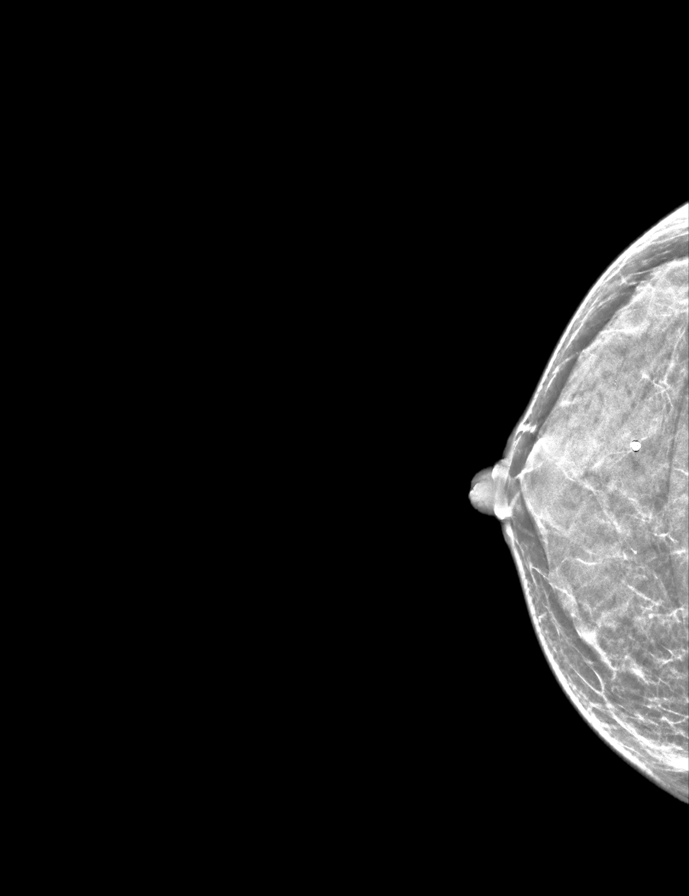

[L MLO synth-2D]
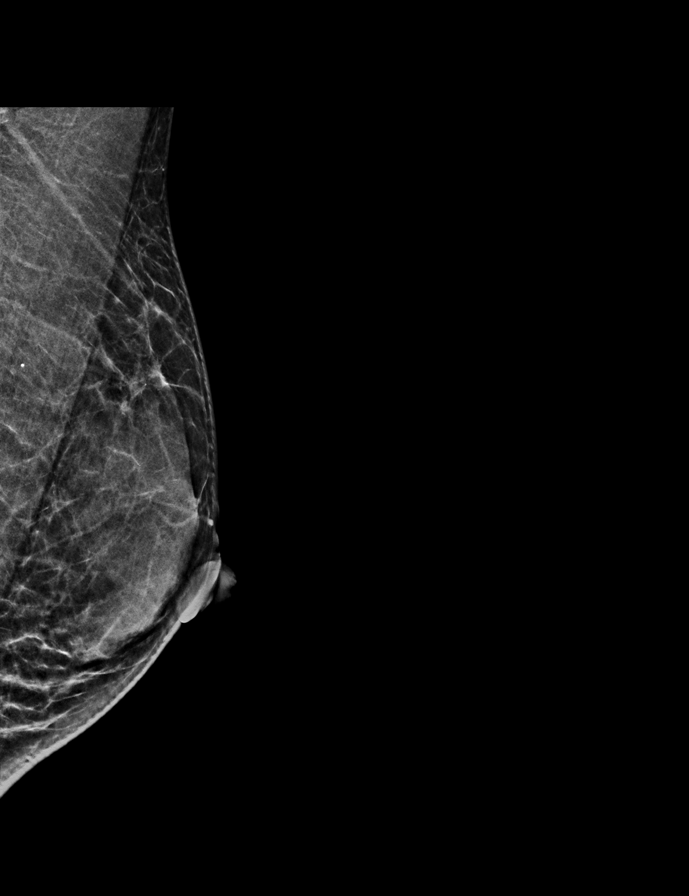

[L CC synth-2D]
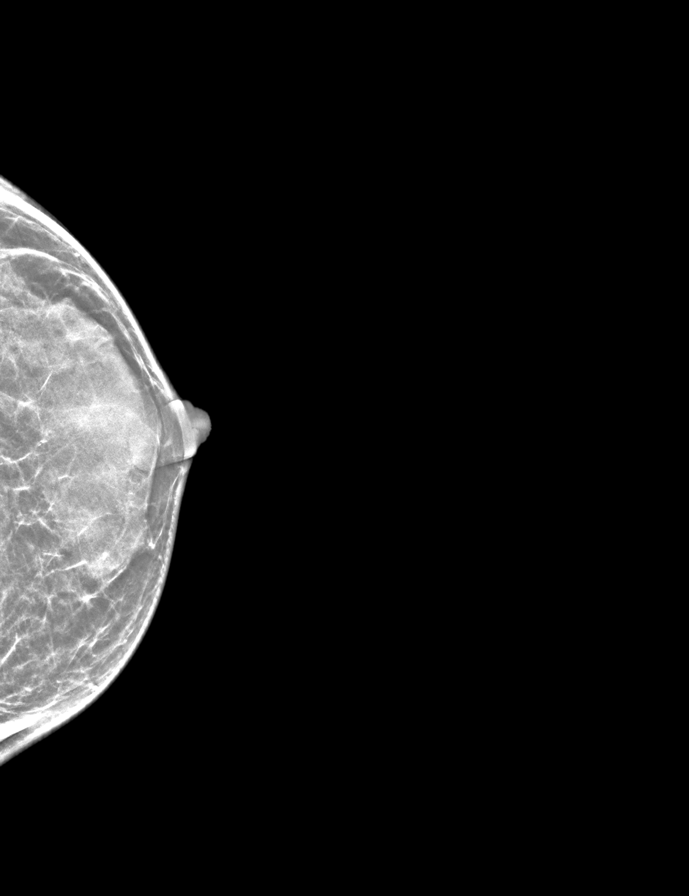

[L CC tomo · 2 of 40 frames shown]
[frame 13/40]
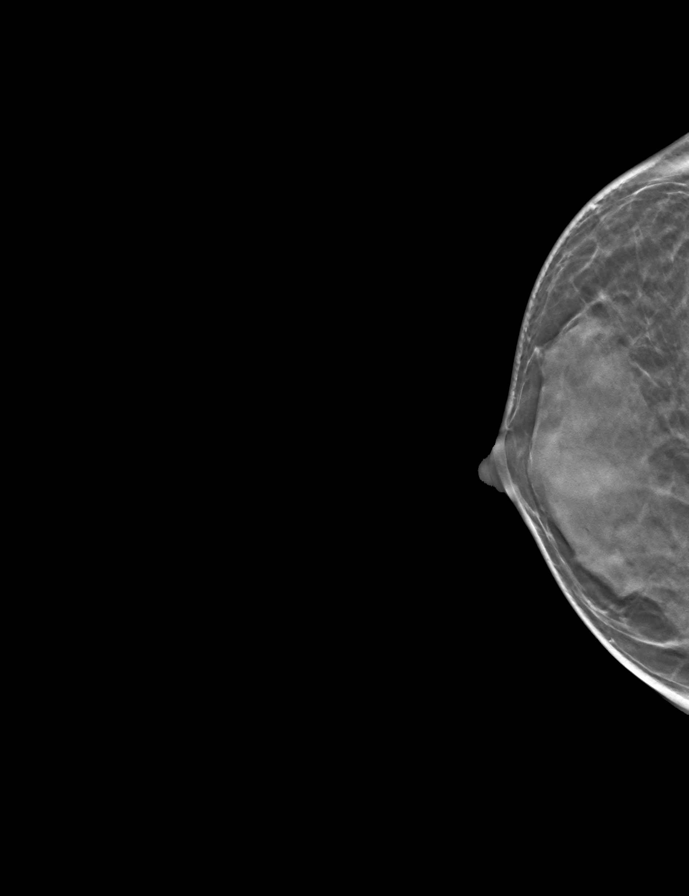
[frame 21/40]
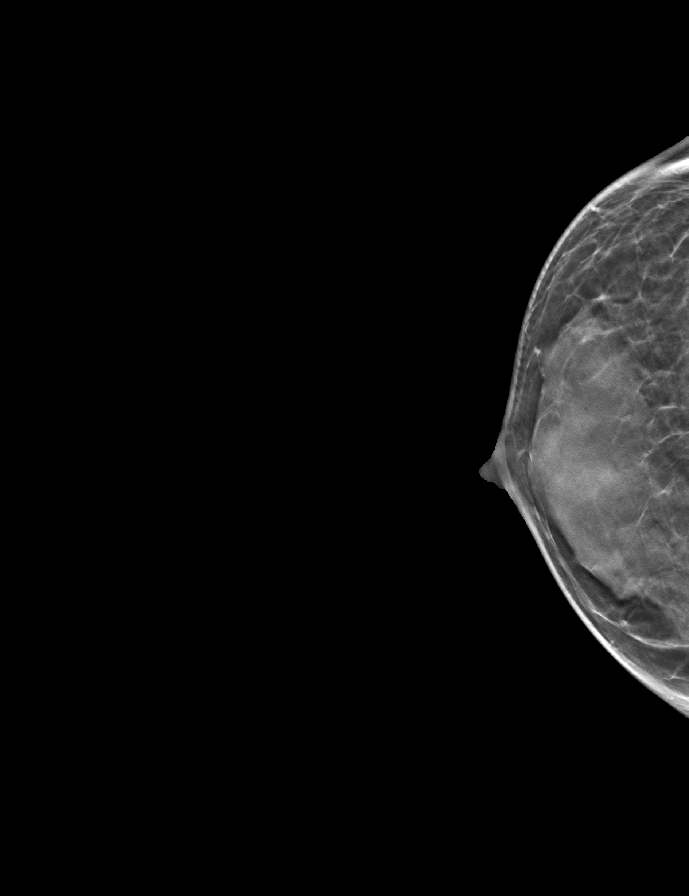

[R MLO tomo · tomo slice 21/40.0]
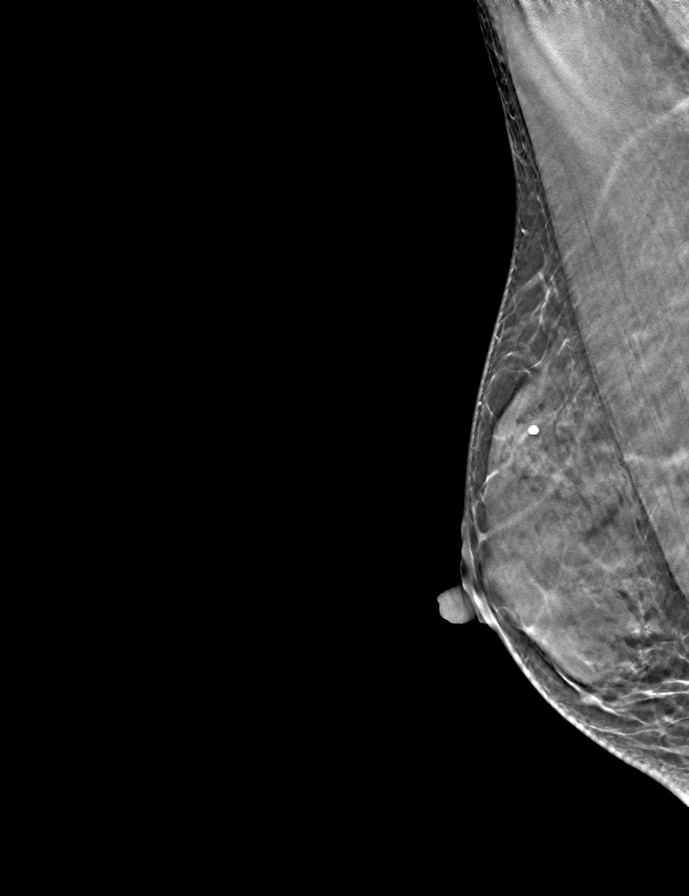

[L MLO tomo · tomo slice 21/41.0]
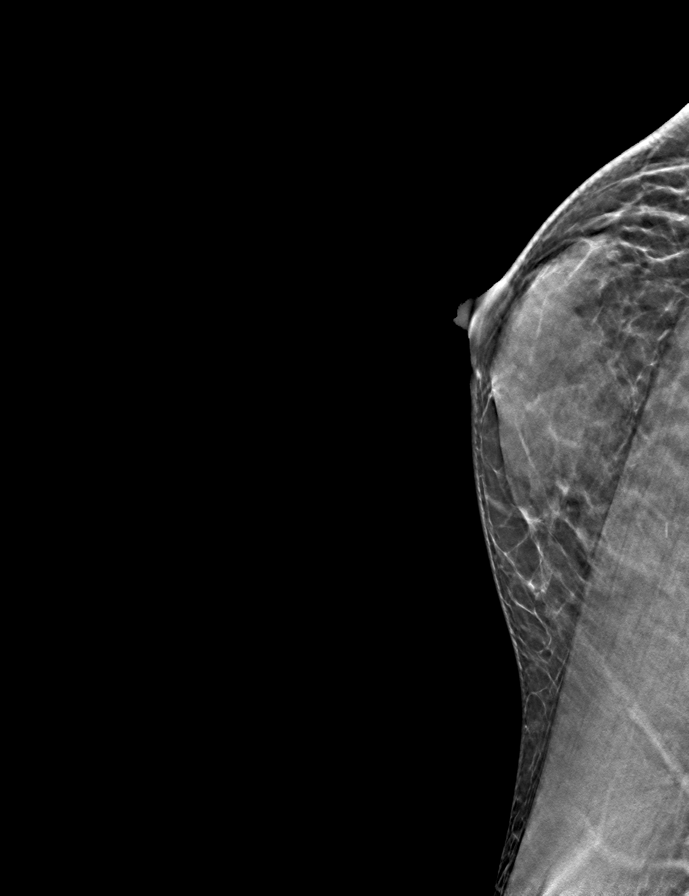

[R CC tomo · tomo slice 21/40.0]
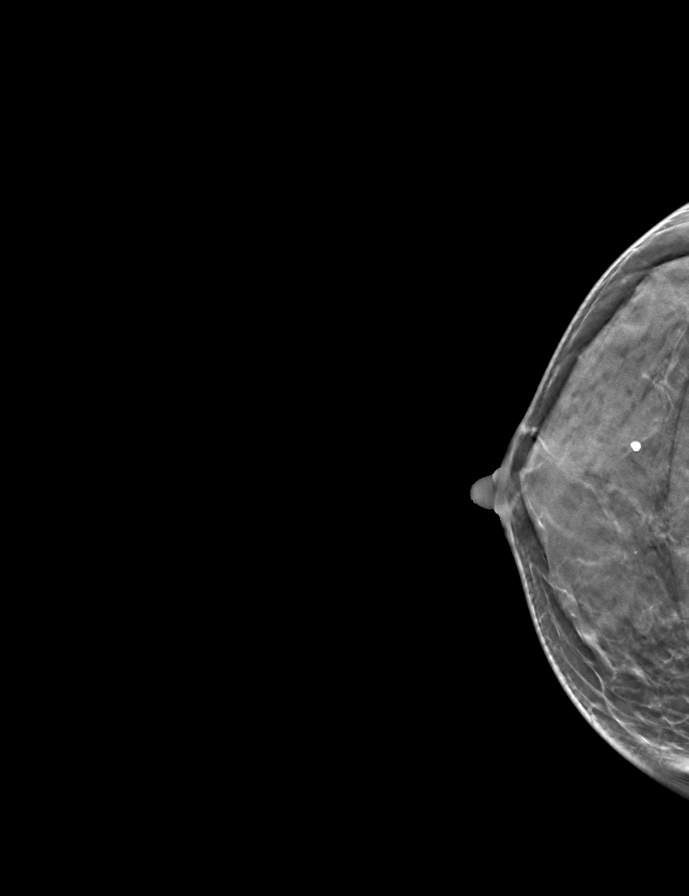

[9 of 24 positions shown; findings below may reference images not displayed]

ACR Breast Density Category d: The breast tissue is extremely dense,
which lowers the sensitivity of mammography
FINDINGS: There are no findings suspicious for malignancy. The images were
evaluated with computer-aided detection.
IMPRESSION: No mammographic evidence of malignancy. A result letter of this
screening mammogram will be mailed directly to the patient.

RECOMMENDATION:
Screening mammogram in one year. (Code:95-0-E9V)

BI-RADS CATEGORY  1: Negative.

## 2022-08-20 ENCOUNTER — Encounter: Payer: Self-pay | Admitting: Internal Medicine

## 2022-08-20 DIAGNOSIS — Z1211 Encounter for screening for malignant neoplasm of colon: Secondary | ICD-10-CM

## 2022-08-22 NOTE — Telephone Encounter (Signed)
Order placed for GI referral.   

## 2022-10-20 DIAGNOSIS — N911 Secondary amenorrhea: Secondary | ICD-10-CM | POA: Diagnosis not present

## 2022-10-20 DIAGNOSIS — Z113 Encounter for screening for infections with a predominantly sexual mode of transmission: Secondary | ICD-10-CM | POA: Diagnosis not present

## 2022-10-20 DIAGNOSIS — Z01419 Encounter for gynecological examination (general) (routine) without abnormal findings: Secondary | ICD-10-CM | POA: Diagnosis not present

## 2022-10-20 DIAGNOSIS — Z114 Encounter for screening for human immunodeficiency virus [HIV]: Secondary | ICD-10-CM | POA: Diagnosis not present

## 2022-10-21 ENCOUNTER — Other Ambulatory Visit: Payer: Self-pay | Admitting: Internal Medicine

## 2022-10-21 DIAGNOSIS — Z1231 Encounter for screening mammogram for malignant neoplasm of breast: Secondary | ICD-10-CM

## 2023-02-02 ENCOUNTER — Ambulatory Visit
Admission: RE | Admit: 2023-02-02 | Discharge: 2023-02-02 | Disposition: A | Discharge status: Home / Self Care | Payer: 59 | Source: Ambulatory Visit | Attending: Internal Medicine | Admitting: Internal Medicine

## 2023-02-02 DIAGNOSIS — Z1231 Encounter for screening mammogram for malignant neoplasm of breast: Secondary | ICD-10-CM

## 2023-02-04 ENCOUNTER — Other Ambulatory Visit: Payer: Self-pay | Admitting: Internal Medicine

## 2023-02-04 ENCOUNTER — Encounter: Payer: Self-pay | Admitting: Internal Medicine

## 2023-02-04 DIAGNOSIS — N6489 Other specified disorders of breast: Secondary | ICD-10-CM

## 2023-02-04 DIAGNOSIS — R928 Other abnormal and inconclusive findings on diagnostic imaging of breast: Secondary | ICD-10-CM

## 2023-02-04 NOTE — Progress Notes (Signed)
Order placed for right breast ultrasound.  Called radiology and cancelled left breast ultrasound. Ordered by mistake.

## 2023-02-09 ENCOUNTER — Ambulatory Visit
Admission: RE | Admit: 2023-02-09 | Discharge: 2023-02-09 | Disposition: A | Discharge status: Home / Self Care | Payer: 59 | Source: Ambulatory Visit | Attending: Internal Medicine | Admitting: Internal Medicine

## 2023-02-09 DIAGNOSIS — R928 Other abnormal and inconclusive findings on diagnostic imaging of breast: Secondary | ICD-10-CM | POA: Insufficient documentation

## 2023-02-09 DIAGNOSIS — N6489 Other specified disorders of breast: Secondary | ICD-10-CM | POA: Diagnosis not present

## 2023-02-09 DIAGNOSIS — R922 Inconclusive mammogram: Secondary | ICD-10-CM | POA: Diagnosis not present

## 2023-02-10 ENCOUNTER — Telehealth: Payer: Self-pay

## 2023-02-10 NOTE — Telephone Encounter (Signed)
Patient states she is returning our call.  I read Dr. Charlene Scott's message to patient.  

## 2023-02-10 NOTE — Telephone Encounter (Signed)
-----   Message from Einar Pheasant, MD sent at 02/10/2023  5:15 AM EDT ----- Please call and notify that I received her follow mammogram report and her f/u views were ok.  Radiology recommended screening mammogram in one year.  Let me know if any questions.

## 2023-02-10 NOTE — Telephone Encounter (Signed)
Noted  

## 2023-02-10 NOTE — Telephone Encounter (Signed)
[facility] 

## 2023-04-03 DIAGNOSIS — D124 Benign neoplasm of descending colon: Secondary | ICD-10-CM | POA: Diagnosis not present

## 2023-04-03 DIAGNOSIS — D125 Benign neoplasm of sigmoid colon: Secondary | ICD-10-CM | POA: Diagnosis not present

## 2023-04-03 DIAGNOSIS — Z1211 Encounter for screening for malignant neoplasm of colon: Secondary | ICD-10-CM | POA: Diagnosis not present

## 2023-04-03 DIAGNOSIS — D120 Benign neoplasm of cecum: Secondary | ICD-10-CM | POA: Diagnosis not present

## 2023-04-03 DIAGNOSIS — D123 Benign neoplasm of transverse colon: Secondary | ICD-10-CM | POA: Diagnosis not present

## 2023-04-03 DIAGNOSIS — Z793 Long term (current) use of hormonal contraceptives: Secondary | ICD-10-CM | POA: Diagnosis not present

## 2023-04-03 DIAGNOSIS — D122 Benign neoplasm of ascending colon: Secondary | ICD-10-CM | POA: Diagnosis not present

## 2023-04-03 DIAGNOSIS — K635 Polyp of colon: Secondary | ICD-10-CM | POA: Diagnosis not present

## 2023-04-03 DIAGNOSIS — K648 Other hemorrhoids: Secondary | ICD-10-CM | POA: Diagnosis not present

## 2023-04-10 ENCOUNTER — Ambulatory Visit (INDEPENDENT_AMBULATORY_CARE_PROVIDER_SITE_OTHER): Payer: 59 | Admitting: Internal Medicine

## 2023-04-10 VITALS — BP 104/66 | HR 70 | Temp 97.9°F | Resp 16 | Ht 61.0 in | Wt 104.0 lb

## 2023-04-10 DIAGNOSIS — Z83719 Family history of colon polyps, unspecified: Secondary | ICD-10-CM | POA: Diagnosis not present

## 2023-04-10 DIAGNOSIS — Z13220 Encounter for screening for lipoid disorders: Secondary | ICD-10-CM | POA: Diagnosis not present

## 2023-04-10 DIAGNOSIS — Z862 Personal history of diseases of the blood and blood-forming organs and certain disorders involving the immune mechanism: Secondary | ICD-10-CM

## 2023-04-10 DIAGNOSIS — Z0000 Encounter for general adult medical examination without abnormal findings: Secondary | ICD-10-CM

## 2023-04-10 DIAGNOSIS — E559 Vitamin D deficiency, unspecified: Secondary | ICD-10-CM

## 2023-04-10 DIAGNOSIS — Z8742 Personal history of other diseases of the female genital tract: Secondary | ICD-10-CM

## 2023-04-10 NOTE — Progress Notes (Addendum)
Subjective:    Patient ID: Alicia Huang, female    DOB: 04-Aug-1976, 47 y.o.   MRN: [id number]  Patient here for  Chief Complaint  Patient presents with   Annual Exam   Allergic Rhinitis     HPI Here for a scheduled physical. Sees Dr Dalbert Garnet for breast and pelvic/pap smears.  Is doing well overall.  She has decreased carb intake.  Decreased alcohol intake.  She is exercising - dancing, going to the gym.  Has lost weight.  Has a good appetite.  No vomiting.  No diarrhea reported.  Just had colonoscopy.  Up to date with gyn care.  IUD - 2019.  Has appt with eye MD - 04/20/23.  Decreased hours at work.    Past Medical History:  Diagnosis Date   Anemia    History of abnormal cervical Pap smear    Past Surgical History:  Procedure Laterality Date   colpolscopy     LAPAROSCOPIC OVARIAN CYSTECTOMY Right 10/17/2019   Procedure: LAPAROSCOPIC OVARIAN CYSTECTOMY;  Surgeon: Christeen Douglas, MD;  Location: [facility];  Service: Gynecology;  Laterality: Right;   LAPAROSCOPY N/A 10/17/2019   Procedure: LAPAROSCOPY OPERATIVE;  Surgeon: Christeen Douglas, MD;  Location: [facility];  Service: Gynecology;  Laterality: N/A;   NO PAST SURGERIES     Family History  Problem Relation Age of Onset   Hyperlipidemia Mother    Hyperlipidemia Father    Hypertension Father    Cancer Father    Breast cancer Neg Hx    Social History   Socioeconomic History   Marital status: Single    Spouse name: Not on file   Number of children: Not on file   Years of education: Not on file   Highest education level: Not on file  Occupational History   Not on file  Tobacco Use   Smoking status: Never   Smokeless tobacco: Never  Vaping Use   Vaping Use: Never used  Substance and Sexual Activity   Alcohol use: Yes    Comment: SOCIAL   Drug use: No   Sexual activity: Not on file  Other Topics Concern   Not on file  Social History Narrative   Not on file   Social Determinants of Health   Financial  Resource Strain: Not on file  Food Insecurity: Not on file  Transportation Needs: Not on file  Physical Activity: Not on file  Stress: Not on file  Social Connections: Not on file     Review of Systems  Constitutional:  Negative for appetite change and unexpected weight change.  HENT:  Negative for congestion, sinus pressure and sore throat.   Eyes:  Negative for pain and visual disturbance.  Respiratory:  Negative for cough, chest tightness and shortness of breath.   Cardiovascular:  Negative for chest pain and palpitations.  Gastrointestinal:  Negative for abdominal pain, diarrhea, nausea and vomiting.  Genitourinary:  Negative for difficulty urinating and dysuria.  Musculoskeletal:  Negative for joint swelling and myalgias.  Skin:  Negative for color change and rash.  Neurological:  Negative for dizziness and headaches.  Hematological:  Negative for adenopathy. Does not bruise/bleed easily.  Psychiatric/Behavioral:  Negative for agitation and dysphoric mood.        Objective:     BP 104/66   Pulse 70   Temp 97.9 F (36.6 C)   Resp 16   Ht 5\' 1"  (1.549 m)   Wt 104 lb (47.2 kg)   SpO2 98%  BMI 19.65 kg/m  Wt Readings from Last 3 Encounters:  04/10/23 104 lb (47.2 kg)  03/14/22 113 lb (51.3 kg)  12/17/20 112 lb (50.8 kg)    Physical Exam Vitals reviewed.  Constitutional:      General: She is not in acute distress.    Appearance: Normal appearance.  HENT:     Head: Normocephalic and atraumatic.     Right Ear: External ear normal.     Left Ear: External ear normal.  Eyes:     General: No scleral icterus.       Right eye: No discharge.        Left eye: No discharge.     Conjunctiva/sclera: Conjunctivae normal.  Neck:     Thyroid: No thyromegaly.  Cardiovascular:     Rate and Rhythm: Normal rate and regular rhythm.  Pulmonary:     Effort: No respiratory distress.     Breath sounds: Normal breath sounds. No wheezing.  Abdominal:     General: Bowel sounds  are normal.     Palpations: Abdomen is soft.     Tenderness: There is no abdominal tenderness.  Musculoskeletal:        General: No swelling or tenderness.     Cervical back: Neck supple. No tenderness.  Lymphadenopathy:     Cervical: No cervical adenopathy.  Skin:    Findings: No erythema or rash.  Neurological:     Mental Status: She is alert.  Psychiatric:        Mood and Affect: Mood normal.        Behavior: Behavior normal.      Outpatient Encounter Medications as of 04/10/2023  Medication Sig   cholecalciferol (VITAMIN D3) 25 MCG (1000 UT) tablet Take 1,000 Units by mouth daily.   levonorgestrel (MIRENA) 20 MCG/24HR IUD by Intrauterine route.   Multiple Vitamins-Minerals (IMMUNE SUPPORT PO) Take 1 Scoop by mouth daily.   vitamin B-12 (CYANOCOBALAMIN) 500 MCG tablet Take 500 mcg by mouth daily.   No facility-administered encounter medications on file as of 04/10/2023.     Lab Results  Component Value Date   WBC 5.9 04/18/2022   HGB 12.9 04/18/2022   HCT 39.9 04/18/2022   PLT 256 04/18/2022   GLUCOSE 90 04/18/2022   CHOL 201 (H) 04/18/2022   TRIG 37 04/18/2022   HDL 90 04/18/2022   LDLCALC 104 (H) 04/18/2022   ALT 12 04/18/2022   AST 17 04/18/2022   NA 140 04/18/2022   K 4.2 04/18/2022   CL 105 04/18/2022   CREATININE 0.51 04/18/2022   BUN 13 04/18/2022   CO2 28 04/18/2022   TSH 1.480 04/18/2022    MM 3D DIAGNOSTIC MAMMOGRAM UNILATERAL RIGHT BREAST  Result Date: 02/09/2023 CLINICAL DATA:  47 year old female presenting as a recall from screening for possible right breast asymmetry. EXAM: DIGITAL DIAGNOSTIC UNILATERAL RIGHT MAMMOGRAM WITH TOMOSYNTHESIS TECHNIQUE: Right digital diagnostic mammography and breast tomosynthesis was performed. COMPARISON:  Previous exam(s). ACR Breast Density Category d: The breasts are extremely dense, which lowers the sensitivity of mammography. FINDINGS: Spot compression tomosynthesis MLO and full true lateral tomosynthesis views of  the right breast were performed for a questioned asymmetry seen only on MLO view in the inferior right breast. On the additional imaging the asymmetry effaces and is most consistent with a normal band of tissue. There is no suspicious finding. IMPRESSION: No mammographic evidence of malignancy in the right breast. RECOMMENDATION: Screening mammogram in one year.(Code:SM-B-01Y) I have discussed the findings and recommendations with  the patient. If applicable, a reminder letter will be sent to the patient regarding the next appointment. BI-RADS CATEGORY  1: Negative. Electronically Signed   By: Emmaline Kluver M.D.   On: 02/09/2023 15:29      Assessment & Plan:  Routine general medical examination at a health care facility  history of vitamin D def Assessment & Plan: History of vitamin D deficiency.  Recheck with next labs.    Orders: -     VITAMIN D 25 Hydroxy (Vit-D Deficiency, Fractures); Future  History of anemia Assessment & Plan: Check cbc iron studies and B12. Follow.   Orders: -     CBC with Differential/Platelet; Future -     Basic metabolic panel; Future -     Hepatic function panel; Future -     TSH; Future -     IBC + Ferritin; Future -     Vitamin B12; Future  Screening cholesterol level -     Lipid panel; Future  Family history of colonic polyps Assessment & Plan: Colonoscopy 03/2023 - f/u in three years.    History of abnormal cervical Pap smear Assessment & Plan: Followed by gyn.  Up to date.     Healthcare maintenance Assessment & Plan: Physical today 04/10/23.  Sees gyn for pelvic and pap smears.  Up to date.  Mammogram 02/04/23 - birads 0. F/u right breast mammogram 02/09/23 - Birads I.  Recommended continued annual screening.  Family history of polyps.  Colonoscopy 03/2023 - recommended f/u colonoscopy in 3 years.     Reviewed above.  Discussed with pt. Up to date with mammogram, pap and colonoscopy.  Follow up labs planned.    Dale Suissevale, MD

## 2023-04-12 ENCOUNTER — Encounter: Payer: Self-pay | Admitting: Internal Medicine

## 2023-04-12 NOTE — Assessment & Plan Note (Signed)
History of vitamin D deficiency.  Recheck with next labs.   

## 2023-04-12 NOTE — Assessment & Plan Note (Signed)
Check cbc iron studies and B12. Follow.

## 2023-04-12 NOTE — Assessment & Plan Note (Signed)
Followed by gyn.  Up to date.   ?

## 2023-04-12 NOTE — Assessment & Plan Note (Signed)
Colonoscopy 03/2023 - f/u in three years.

## 2023-05-14 ENCOUNTER — Encounter: Payer: Self-pay | Admitting: Internal Medicine

## 2023-05-14 NOTE — Telephone Encounter (Signed)
Requesting Coding reconsideration for DOS 05/09/23. Per the patient she was seen for a physical and not regular office visit.

## 2023-05-16 NOTE — Addendum Note (Signed)
Addended by: Charm Barges on: 05/16/2023 05:54 PM   Modules accepted: Level of Service

## 2023-05-16 NOTE — Assessment & Plan Note (Signed)
Physical today 04/10/23.  Sees gyn for pelvic and pap smears.  Up to date.  Mammogram 02/04/23 - birads 0. F/u right breast mammogram 02/09/23 - Birads I.  Recommended continued annual screening.  Family history of polyps.  Colonoscopy 03/2023 - recommended f/u colonoscopy in 3 years.

## 2023-07-24 ENCOUNTER — Other Ambulatory Visit: Payer: 59

## 2023-07-24 DIAGNOSIS — E559 Vitamin D deficiency, unspecified: Secondary | ICD-10-CM | POA: Diagnosis not present

## 2023-07-24 DIAGNOSIS — Z13220 Encounter for screening for lipoid disorders: Secondary | ICD-10-CM | POA: Diagnosis not present

## 2023-07-24 DIAGNOSIS — Z862 Personal history of diseases of the blood and blood-forming organs and certain disorders involving the immune mechanism: Secondary | ICD-10-CM | POA: Diagnosis not present

## 2023-07-24 LAB — HEPATIC FUNCTION PANEL
ALT: 20 U/L (ref 0–35)
AST: 16 U/L (ref 0–37)
Albumin: 4.2 g/dL (ref 3.5–5.2)
Alkaline Phosphatase: 62 U/L (ref 39–117)
Bilirubin, Direct: 0.2 mg/dL (ref 0.0–0.3)
Total Bilirubin: 1.1 mg/dL (ref 0.2–1.2)
Total Protein: 6.9 g/dL (ref 6.0–8.3)

## 2023-07-24 LAB — TSH: TSH: 1.66 u[IU]/mL (ref 0.35–5.50)

## 2023-07-24 LAB — CBC WITH DIFFERENTIAL/PLATELET
Basophils Absolute: 0 10*3/uL (ref 0.0–0.1)
Basophils Relative: 1.2 % (ref 0.0–3.0)
Eosinophils Absolute: 0.2 10*3/uL (ref 0.0–0.7)
Eosinophils Relative: 6 % — ABNORMAL HIGH (ref 0.0–5.0)
HCT: 38.6 % (ref 36.0–46.0)
Hemoglobin: 12.8 g/dL (ref 12.0–15.0)
Lymphocytes Relative: 49.7 % — ABNORMAL HIGH (ref 12.0–46.0)
Lymphs Abs: 1.6 10*3/uL (ref 0.7–4.0)
MCHC: 33.1 g/dL (ref 30.0–36.0)
MCV: 92.7 fl (ref 78.0–100.0)
Monocytes Absolute: 0.3 10*3/uL (ref 0.1–1.0)
Monocytes Relative: 10 % (ref 3.0–12.0)
Neutro Abs: 1 10*3/uL — ABNORMAL LOW (ref 1.4–7.7)
Neutrophils Relative %: 33.1 % — ABNORMAL LOW (ref 43.0–77.0)
Platelets: 251 10*3/uL (ref 150.0–400.0)
RBC: 4.17 Mil/uL (ref 3.87–5.11)
RDW: 12.6 % (ref 11.5–15.5)
WBC: 3.2 10*3/uL — ABNORMAL LOW (ref 4.0–10.5)

## 2023-07-24 LAB — LIPID PANEL
Cholesterol: 218 mg/dL — ABNORMAL HIGH (ref 0–200)
HDL: 87 mg/dL (ref >39.00)
LDL Cholesterol: 119 mg/dL — ABNORMAL HIGH (ref 0–99)
NonHDL: 131.21
Total CHOL/HDL Ratio: 3
Triglycerides: 60 mg/dL (ref 0.0–149.0)
VLDL: 12 mg/dL (ref 0.0–40.0)

## 2023-07-24 LAB — BASIC METABOLIC PANEL
BUN: 11 mg/dL (ref 6–23)
CO2: 28 mEq/L (ref 19–32)
Calcium: 9.4 mg/dL (ref 8.4–10.5)
Chloride: 105 mEq/L (ref 96–112)
Creatinine, Ser: 0.6 mg/dL (ref 0.40–1.20)
GFR: 107.8 mL/min (ref >60.00)
Glucose, Bld: 78 mg/dL (ref 70–99)
Potassium: 4.1 mEq/L (ref 3.5–5.1)
Sodium: 140 mEq/L (ref 135–145)

## 2023-07-24 LAB — VITAMIN D 25 HYDROXY (VIT D DEFICIENCY, FRACTURES): VITD: 61.86 ng/mL (ref 30.00–100.00)

## 2023-07-24 LAB — IBC + FERRITIN
Ferritin: 45.5 ng/mL (ref 10.0–291.0)
Iron: 110 ug/dL (ref 42–145)
Saturation Ratios: 37.2 % (ref 20.0–50.0)
TIBC: 295.4 ug/dL (ref 250.0–450.0)
Transferrin: 211 mg/dL — ABNORMAL LOW (ref 212.0–360.0)

## 2023-07-24 LAB — VITAMIN B12: Vitamin B-12: 415 pg/mL (ref 211–911)

## 2023-07-28 ENCOUNTER — Other Ambulatory Visit: Payer: 59

## 2023-07-29 ENCOUNTER — Telehealth: Payer: Self-pay

## 2023-07-29 DIAGNOSIS — D72819 Decreased white blood cell count, unspecified: Secondary | ICD-10-CM

## 2023-07-29 NOTE — Telephone Encounter (Signed)
-----   Message from [place] sent at 07/25/2023  1:17 PM EDT ----- Please notify - white blood cell count slightly decreased.  Has varied previously and this can be a normal variant.  We will need to follow.  I would like to recheck cbc in 4-6 weeks to confirm stable/normal. Hgb wnl. Cholesterol levels are ok.  B12 level and vitamin D level - wnl. Thyroid test, kidney function tests and liver function tests are wnl.

## 2023-09-14 DIAGNOSIS — L578 Other skin changes due to chronic exposure to nonionizing radiation: Secondary | ICD-10-CM | POA: Diagnosis not present

## 2023-09-14 DIAGNOSIS — L819 Disorder of pigmentation, unspecified: Secondary | ICD-10-CM | POA: Diagnosis not present

## 2023-10-26 DIAGNOSIS — Z1331 Encounter for screening for depression: Secondary | ICD-10-CM | POA: Diagnosis not present

## 2023-10-26 DIAGNOSIS — Z113 Encounter for screening for infections with a predominantly sexual mode of transmission: Secondary | ICD-10-CM | POA: Diagnosis not present

## 2023-10-26 DIAGNOSIS — Z01419 Encounter for gynecological examination (general) (routine) without abnormal findings: Secondary | ICD-10-CM | POA: Diagnosis not present

## 2024-03-21 ENCOUNTER — Other Ambulatory Visit

## 2024-03-23 ENCOUNTER — Telehealth: Payer: Self-pay

## 2024-03-23 DIAGNOSIS — Z13220 Encounter for screening for lipoid disorders: Secondary | ICD-10-CM

## 2024-03-23 DIAGNOSIS — Z862 Personal history of diseases of the blood and blood-forming organs and certain disorders involving the immune mechanism: Secondary | ICD-10-CM

## 2024-03-23 DIAGNOSIS — E559 Vitamin D deficiency, unspecified: Secondary | ICD-10-CM

## 2024-03-23 NOTE — Telephone Encounter (Signed)
 Copied from CRM [id number]. Topic: Clinical - Request for Lab/Test Order >> Mar 23, 2024  7:49 AM Allyne Areola wrote: Reason for CRM: Patient is requesting  CPE lab orders be sent to [facility], She works there and would be much easier to have labs done there.

## 2024-03-23 NOTE — Addendum Note (Signed)
 Addended by: Victorino Grates D on: 03/23/2024 11:02 AM   Modules accepted: Orders

## 2024-03-24 NOTE — Telephone Encounter (Signed)
 Pt is aware.

## 2024-03-25 ENCOUNTER — Other Ambulatory Visit

## 2024-05-16 ENCOUNTER — Encounter: Admitting: Internal Medicine

## 2024-05-31 ENCOUNTER — Other Ambulatory Visit: Payer: Self-pay | Admitting: Internal Medicine

## 2024-05-31 DIAGNOSIS — Z1231 Encounter for screening mammogram for malignant neoplasm of breast: Secondary | ICD-10-CM

## 2024-06-13 ENCOUNTER — Other Ambulatory Visit
Admission: RE | Admit: 2024-06-13 | Discharge: 2024-06-13 | Disposition: A | Discharge status: Home / Self Care | Attending: Internal Medicine | Admitting: Internal Medicine

## 2024-06-13 ENCOUNTER — Encounter: Admitting: Internal Medicine

## 2024-06-13 DIAGNOSIS — Z13220 Encounter for screening for lipoid disorders: Secondary | ICD-10-CM | POA: Insufficient documentation

## 2024-06-13 DIAGNOSIS — Z862 Personal history of diseases of the blood and blood-forming organs and certain disorders involving the immune mechanism: Secondary | ICD-10-CM | POA: Insufficient documentation

## 2024-06-13 DIAGNOSIS — E559 Vitamin D deficiency, unspecified: Secondary | ICD-10-CM | POA: Diagnosis not present

## 2024-06-13 LAB — BASIC METABOLIC PANEL WITH GFR
Anion gap: 8 (ref 5–15)
BUN: 11 mg/dL (ref 6–20)
CO2: 28 mmol/L (ref 22–32)
Calcium: 9.4 mg/dL (ref 8.9–10.3)
Chloride: 101 mmol/L (ref 98–111)
Creatinine, Ser: 0.58 mg/dL (ref 0.44–1.00)
GFR, Estimated: >60 mL/min (ref >60)
Glucose, Bld: 86 mg/dL (ref 70–99)
Potassium: 4.1 mmol/L (ref 3.5–5.1)
Sodium: 137 mmol/L (ref 135–145)

## 2024-06-13 LAB — HEPATIC FUNCTION PANEL
ALT: 13 U/L (ref 0–44)
AST: 18 U/L (ref 15–41)
Albumin: 3.8 g/dL (ref 3.5–5.0)
Alkaline Phosphatase: 62 U/L (ref 38–126)
Bilirubin, Direct: 0.1 mg/dL (ref 0.0–0.2)
Indirect Bilirubin: 1.2 mg/dL — ABNORMAL HIGH (ref 0.3–0.9)
Total Bilirubin: 1.3 mg/dL — ABNORMAL HIGH (ref 0.0–1.2)
Total Protein: 6.5 g/dL (ref 6.5–8.1)

## 2024-06-13 LAB — LIPID PANEL
Cholesterol: 211 mg/dL — ABNORMAL HIGH (ref 0–200)
HDL: 89 mg/dL (ref >40)
LDL Cholesterol: 114 mg/dL — ABNORMAL HIGH (ref 0–99)
Total CHOL/HDL Ratio: 2.4 ratio
Triglycerides: 42 mg/dL (ref <150)
VLDL: 8 mg/dL (ref 0–40)

## 2024-06-13 LAB — CBC WITH DIFFERENTIAL/PLATELET
Abs Immature Granulocytes: 0.05 K/uL (ref 0.00–0.07)
Basophils Absolute: 0 K/uL (ref 0.0–0.1)
Basophils Relative: 1 %
Eosinophils Absolute: 0.3 K/uL (ref 0.0–0.5)
Eosinophils Relative: 8 %
HCT: 37.1 % (ref 36.0–46.0)
Hemoglobin: 12.7 g/dL (ref 12.0–15.0)
Immature Granulocytes: 1 %
Lymphocytes Relative: 43 %
Lymphs Abs: 1.7 K/uL (ref 0.7–4.0)
MCH: 31.4 pg (ref 26.0–34.0)
MCHC: 34.2 g/dL (ref 30.0–36.0)
MCV: 91.6 fL (ref 80.0–100.0)
Monocytes Absolute: 0.4 K/uL (ref 0.1–1.0)
Monocytes Relative: 9 %
Neutro Abs: 1.5 K/uL — ABNORMAL LOW (ref 1.7–7.7)
Neutrophils Relative %: 38 %
Platelets: 240 K/uL (ref 150–400)
RBC: 4.05 MIL/uL (ref 3.87–5.11)
RDW: 12.1 % (ref 11.5–15.5)
WBC: 4.1 K/uL (ref 4.0–10.5)
nRBC: 0 % (ref 0.0–0.2)

## 2024-06-13 LAB — TSH: TSH: 2.689 u[IU]/mL (ref 0.350–4.500)

## 2024-06-14 ENCOUNTER — Ambulatory Visit
Admission: RE | Admit: 2024-06-14 | Discharge: 2024-06-14 | Disposition: A | Discharge status: Home / Self Care | Source: Ambulatory Visit | Attending: Internal Medicine | Admitting: Internal Medicine

## 2024-06-14 ENCOUNTER — Ambulatory Visit: Payer: Self-pay | Admitting: Internal Medicine

## 2024-06-14 DIAGNOSIS — E559 Vitamin D deficiency, unspecified: Secondary | ICD-10-CM

## 2024-06-14 DIAGNOSIS — Z1231 Encounter for screening mammogram for malignant neoplasm of breast: Secondary | ICD-10-CM | POA: Diagnosis not present

## 2024-06-14 DIAGNOSIS — Z13220 Encounter for screening for lipoid disorders: Secondary | ICD-10-CM

## 2024-06-14 DIAGNOSIS — Z862 Personal history of diseases of the blood and blood-forming organs and certain disorders involving the immune mechanism: Secondary | ICD-10-CM

## 2024-06-14 NOTE — Telephone Encounter (Signed)
 Copied from CRM [id number]. Topic: Appointments - Appointment Scheduling >> Jun 14, 2024  3:16 PM Rosina BIRCH wrote: Patient/patient representative is calling to schedule an appointment. Refer to attachments for appointment information.   Patient returned the call and the lab results were read to her. Patient want to see if she can get her labs done at [facility]  CB [phone]

## 2024-07-18 ENCOUNTER — Ambulatory Visit (INDEPENDENT_AMBULATORY_CARE_PROVIDER_SITE_OTHER): Admitting: Internal Medicine

## 2024-07-18 VITALS — BP 106/68 | HR 80 | Resp 16 | Ht 61.0 in | Wt 112.4 lb

## 2024-07-18 DIAGNOSIS — Z83719 Family history of colon polyps, unspecified: Secondary | ICD-10-CM

## 2024-07-18 DIAGNOSIS — Z0000 Encounter for general adult medical examination without abnormal findings: Secondary | ICD-10-CM | POA: Diagnosis not present

## 2024-07-18 NOTE — Progress Notes (Signed)
 Subjective:    Patient ID: Alicia Huang, female    DOB: Jan 06, 1976, 48 y.o.   MRN: [id number]  Patient here for  Chief Complaint  Patient presents with   Annual Exam    HPI Here for a physical exam. Sees Dr Verdon for gyn exams. Has IUD in place. No bleeding. Stays active. Breathing stable. No abdominal pain or bowel change reported. Has f/u with Dr Verdon in 10/2024. Working 30 hours per week. Stress better. No increased allergy symptoms.    Past Medical History:  Diagnosis Date   Anemia    History of abnormal cervical Pap smear    Past Surgical History:  Procedure Laterality Date   colpolscopy     LAPAROSCOPIC OVARIAN CYSTECTOMY Right 10/17/2019   Procedure: LAPAROSCOPIC OVARIAN CYSTECTOMY;  Surgeon: Verdon Keen, MD;  Location: [facility];  Service: Gynecology;  Laterality: Right;   LAPAROSCOPY N/A 10/17/2019   Procedure: LAPAROSCOPY OPERATIVE;  Surgeon: Verdon Keen, MD;  Location: [facility];  Service: Gynecology;  Laterality: N/A;   NO PAST SURGERIES     Family History  Problem Relation Age of Onset   Hyperlipidemia Mother    Hyperlipidemia Father    Hypertension Father    Cancer Father    Breast cancer Neg Hx    Social History   Socioeconomic History   Marital status: Single    Spouse name: Not on file   Number of children: Not on file   Years of education: Not on file   Highest education level: Not on file  Occupational History   Not on file  Tobacco Use   Smoking status: Never   Smokeless tobacco: Never  Vaping Use   Vaping status: Never Used  Substance and Sexual Activity   Alcohol use: Yes    Comment: SOCIAL   Drug use: No   Sexual activity: Not on file  Other Topics Concern   Not on file  Social History Narrative   Not on file   Social Drivers of Health   Financial Resource Strain: Not on file  Food Insecurity: Not on file  Transportation Needs: Not on file  Physical Activity: Not on file  Stress: Not on file  Social  Connections: Not on file     Review of Systems  Constitutional:  Negative for appetite change and unexpected weight change.  HENT:  Negative for congestion, sinus pressure and sore throat.   Eyes:  Negative for pain and visual disturbance.  Respiratory:  Negative for cough, chest tightness and shortness of breath.   Cardiovascular:  Negative for chest pain, palpitations and leg swelling.  Gastrointestinal:  Negative for abdominal pain, diarrhea, nausea and vomiting.  Genitourinary:  Negative for difficulty urinating and dysuria.  Musculoskeletal:  Negative for joint swelling and myalgias.  Skin:  Negative for color change and rash.  Neurological:  Negative for dizziness and headaches.  Hematological:  Negative for adenopathy. Does not bruise/bleed easily.  Psychiatric/Behavioral:  Negative for agitation and dysphoric mood.        Objective:     BP 106/68   Pulse 80   Resp 16   Ht 5' 1 (1.549 m)   Wt 112 lb 6.4 oz (51 kg)   SpO2 98%   BMI 21.24 kg/m  Wt Readings from Last 3 Encounters:  07/18/24 112 lb 6.4 oz (51 kg)  04/10/23 104 lb (47.2 kg)  03/14/22 113 lb (51.3 kg)    Physical Exam Vitals reviewed.  Constitutional:  General: She is not in acute distress.    Appearance: Normal appearance.  HENT:     Head: Normocephalic and atraumatic.     Right Ear: External ear normal.     Left Ear: External ear normal.     Mouth/Throat:     Pharynx: No oropharyngeal exudate or posterior oropharyngeal erythema.  Eyes:     General: No scleral icterus.       Right eye: No discharge.        Left eye: No discharge.     Conjunctiva/sclera: Conjunctivae normal.  Neck:     Thyroid : No thyromegaly.  Cardiovascular:     Rate and Rhythm: Normal rate and regular rhythm.  Pulmonary:     Effort: No respiratory distress.     Breath sounds: Normal breath sounds. No wheezing.  Abdominal:     General: Bowel sounds are normal.     Palpations: Abdomen is soft.     Tenderness:  There is no abdominal tenderness.  Genitourinary:    Comments: Performed by gyn.  Musculoskeletal:        General: No swelling or tenderness.     Cervical back: Neck supple. No tenderness.  Lymphadenopathy:     Cervical: No cervical adenopathy.  Skin:    Findings: No erythema or rash.  Neurological:     Mental Status: She is alert.  Psychiatric:        Mood and Affect: Mood normal.        Behavior: Behavior normal.         Outpatient Encounter Medications as of 07/18/2024  Medication Sig   UNABLE TO FIND daily. Med Name: LIQUID IRON   cholecalciferol (VITAMIN D3) 25 MCG (1000 UT) tablet Take 1,000 Units by mouth daily.   levonorgestrel  (MIRENA ) 20 MCG/24HR IUD by Intrauterine route.   Multiple Vitamins-Minerals (IMMUNE SUPPORT PO) Take 1 Scoop by mouth daily.   [DISCONTINUED] vitamin B-12 (CYANOCOBALAMIN ) 500 MCG tablet Take 500 mcg by mouth daily.   No facility-administered encounter medications on file as of 07/18/2024.     Lab Results  Component Value Date   WBC 4.1 06/13/2024   HGB 12.7 06/13/2024   HCT 37.1 06/13/2024   PLT 240 06/13/2024   GLUCOSE 86 06/13/2024   CHOL 211 (H) 06/13/2024   TRIG 42 06/13/2024   HDL 89 06/13/2024   LDLCALC 114 (H) 06/13/2024   ALT 13 06/13/2024   AST 18 06/13/2024   NA 137 06/13/2024   K 4.1 06/13/2024   CL 101 06/13/2024   CREATININE 0.58 06/13/2024   BUN 11 06/13/2024   CO2 28 06/13/2024   TSH 2.689 06/13/2024    MM 3D SCREENING MAMMOGRAM BILATERAL BREAST Result Date: 06/17/2024 CLINICAL DATA:  Screening. EXAM: DIGITAL SCREENING BILATERAL MAMMOGRAM WITH TOMOSYNTHESIS AND CAD TECHNIQUE: Bilateral screening digital craniocaudal and mediolateral oblique mammograms were obtained. Bilateral screening digital breast tomosynthesis was performed. The images were evaluated with computer-aided detection. COMPARISON:  Previous exam(s). ACR Breast Density Category d: The breasts are extremely dense, which lowers the sensitivity of  mammography. FINDINGS: There are no findings suspicious for malignancy. IMPRESSION: No mammographic evidence of malignancy. A result letter of this screening mammogram will be mailed directly to the patient. RECOMMENDATION: Screening mammogram in one year. (Code:SM-B-01Y) BI-RADS CATEGORY  1: Negative. Electronically Signed   By: Dina  Arceo M.D.   On: 06/17/2024 05:08       Assessment & Plan:  Routine general medical examination at a health care facility  Healthcare maintenance Assessment &  Plan: Physical today 07/18/24.  Sees gyn for pelvic and pap smears.  Up to date.  Mammogram 02/04/23 - birads 0. F/u right breast mammogram 02/09/23 - Birads I.  Recommended continued annual screening.  Mammogram 06/14/24 - Birads I. Family history of polyps.  Colonoscopy 03/2023 - recommended f/u colonoscopy in 3 years.    Family history of colonic polyps Assessment & Plan: Colonoscopy 03/2023 - f/u in three years.       Allena Hamilton, MD

## 2024-07-18 NOTE — Assessment & Plan Note (Signed)
 Physical today 07/18/24.  Sees gyn for pelvic and pap smears.  Up to date.  Mammogram 02/04/23 - birads 0. F/u right breast mammogram 02/09/23 - Birads I.  Recommended continued annual screening.  Mammogram 06/14/24 - Birads I. Family history of polyps.  Colonoscopy 03/2023 - recommended f/u colonoscopy in 3 years.

## 2024-07-24 ENCOUNTER — Encounter: Payer: Self-pay | Admitting: Internal Medicine

## 2024-07-24 NOTE — Assessment & Plan Note (Signed)
Colonoscopy 03/2023 - f/u in three years.

## 2024-08-01 ENCOUNTER — Other Ambulatory Visit

## 2024-08-03 ENCOUNTER — Other Ambulatory Visit

## 2024-08-04 ENCOUNTER — Other Ambulatory Visit

## 2024-08-22 ENCOUNTER — Other Ambulatory Visit (INDEPENDENT_AMBULATORY_CARE_PROVIDER_SITE_OTHER)

## 2024-08-22 ENCOUNTER — Ambulatory Visit: Payer: Self-pay | Admitting: Internal Medicine

## 2024-08-22 DIAGNOSIS — Z13220 Encounter for screening for lipoid disorders: Secondary | ICD-10-CM

## 2024-08-22 DIAGNOSIS — Z862 Personal history of diseases of the blood and blood-forming organs and certain disorders involving the immune mechanism: Secondary | ICD-10-CM | POA: Diagnosis not present

## 2024-08-22 DIAGNOSIS — E559 Vitamin D deficiency, unspecified: Secondary | ICD-10-CM

## 2024-08-22 LAB — HEPATIC FUNCTION PANEL
ALT: 12 U/L (ref 0–35)
AST: 17 U/L (ref 0–37)
Albumin: 4.4 g/dL (ref 3.5–5.2)
Alkaline Phosphatase: 58 U/L (ref 39–117)
Bilirubin, Direct: 0.1 mg/dL (ref 0.0–0.3)
Total Bilirubin: 0.8 mg/dL (ref 0.2–1.2)
Total Protein: 7.3 g/dL (ref 6.0–8.3)

## 2024-08-22 NOTE — Addendum Note (Signed)
 Addended by: TANDA HARVEY D on: 08/22/2024 01:30 PM   Modules accepted: Orders

## 2024-10-11 NOTE — Telephone Encounter (Signed)
 open in error

## 2024-12-19 ENCOUNTER — Encounter: Admitting: Obstetrics

## 2025-07-24 ENCOUNTER — Encounter: Admitting: Internal Medicine
